# Patient Record
Sex: Female | Born: 1945 | Race: White | Hispanic: No | Smoking: Never smoker
Health system: Southern US, Community
[De-identification: ages and names within clinical notes are randomized; demographics above are authoritative.]

## PROBLEM LIST (undated history)

## (undated) DIAGNOSIS — M25569 Pain in unspecified knee: Secondary | ICD-10-CM

## (undated) DIAGNOSIS — M8580 Other specified disorders of bone density and structure, unspecified site: Secondary | ICD-10-CM

## (undated) DIAGNOSIS — H9313 Tinnitus, bilateral: Secondary | ICD-10-CM

## (undated) DIAGNOSIS — M5432 Sciatica, left side: Secondary | ICD-10-CM

## (undated) DIAGNOSIS — E559 Vitamin D deficiency, unspecified: Secondary | ICD-10-CM

## (undated) DIAGNOSIS — G43909 Migraine, unspecified, not intractable, without status migrainosus: Secondary | ICD-10-CM

## (undated) DIAGNOSIS — E7800 Pure hypercholesterolemia, unspecified: Secondary | ICD-10-CM

## (undated) DIAGNOSIS — M1990 Unspecified osteoarthritis, unspecified site: Secondary | ICD-10-CM

## (undated) DIAGNOSIS — K7689 Other specified diseases of liver: Secondary | ICD-10-CM

## (undated) DIAGNOSIS — I10 Essential (primary) hypertension: Secondary | ICD-10-CM

## (undated) DIAGNOSIS — R7303 Prediabetes: Secondary | ICD-10-CM

## (undated) HISTORY — DX: Pain in unspecified knee: M25.569

## (undated) HISTORY — DX: Tinnitus, bilateral: H93.13

## (undated) HISTORY — PX: TONSILLECTOMY: SUR1361

## (undated) HISTORY — PX: LAPAROSCOPIC CHOLECYSTECTOMY: SUR755

## (undated) HISTORY — PX: ABDOMINAL HYSTERECTOMY: SHX81

## (undated) HISTORY — DX: Vitamin D deficiency, unspecified: E55.9

## (undated) HISTORY — DX: Other specified disorders of bone density and structure, unspecified site: M85.80

## (undated) HISTORY — DX: Sciatica, left side: M54.32

## (undated) HISTORY — DX: Prediabetes: R73.03

## (undated) HISTORY — DX: Pure hypercholesterolemia, unspecified: E78.00

---

## 1998-04-22 ENCOUNTER — Other Ambulatory Visit: Admission: RE | Admit: 1998-04-22 | Discharge: 1998-04-22 | Payer: Self-pay | Admitting: Obstetrics and Gynecology

## 1999-11-02 ENCOUNTER — Encounter: Admission: RE | Admit: 1999-11-02 | Discharge: 1999-11-02 | Payer: Self-pay | Admitting: Obstetrics and Gynecology

## 1999-11-02 ENCOUNTER — Encounter: Payer: Self-pay | Admitting: Obstetrics and Gynecology

## 1999-11-03 ENCOUNTER — Other Ambulatory Visit: Admission: RE | Admit: 1999-11-03 | Discharge: 1999-11-03 | Payer: Self-pay | Admitting: Obstetrics and Gynecology

## 2000-06-17 ENCOUNTER — Encounter: Payer: Self-pay | Admitting: Emergency Medicine

## 2000-06-17 ENCOUNTER — Emergency Department (HOSPITAL_COMMUNITY): Admission: EM | Admit: 2000-06-17 | Discharge: 2000-06-17 | Payer: Self-pay | Admitting: Emergency Medicine

## 2001-01-10 ENCOUNTER — Encounter: Admission: RE | Admit: 2001-01-10 | Discharge: 2001-01-10 | Payer: Self-pay | Admitting: Obstetrics and Gynecology

## 2001-01-10 ENCOUNTER — Encounter: Payer: Self-pay | Admitting: Obstetrics and Gynecology

## 2001-01-26 ENCOUNTER — Other Ambulatory Visit: Admission: RE | Admit: 2001-01-26 | Discharge: 2001-01-26 | Payer: Self-pay | Admitting: Obstetrics and Gynecology

## 2002-02-19 ENCOUNTER — Emergency Department (HOSPITAL_COMMUNITY): Admission: EM | Admit: 2002-02-19 | Discharge: 2002-02-19 | Payer: Self-pay | Admitting: Emergency Medicine

## 2002-02-19 ENCOUNTER — Encounter: Payer: Self-pay | Admitting: Emergency Medicine

## 2002-02-23 ENCOUNTER — Encounter: Payer: Self-pay | Admitting: Family Medicine

## 2002-02-23 ENCOUNTER — Encounter: Admission: RE | Admit: 2002-02-23 | Discharge: 2002-02-23 | Payer: Self-pay | Admitting: Family Medicine

## 2002-02-26 ENCOUNTER — Encounter: Admission: RE | Admit: 2002-02-26 | Discharge: 2002-02-26 | Payer: Self-pay | Admitting: Obstetrics and Gynecology

## 2002-02-26 ENCOUNTER — Encounter: Payer: Self-pay | Admitting: Obstetrics and Gynecology

## 2002-03-02 ENCOUNTER — Encounter: Payer: Self-pay | Admitting: Surgery

## 2002-03-02 ENCOUNTER — Encounter (INDEPENDENT_AMBULATORY_CARE_PROVIDER_SITE_OTHER): Payer: Self-pay | Admitting: Specialist

## 2002-03-02 ENCOUNTER — Ambulatory Visit (HOSPITAL_COMMUNITY): Admission: RE | Admit: 2002-03-02 | Discharge: 2002-03-03 | Payer: Self-pay | Admitting: Surgery

## 2002-05-02 ENCOUNTER — Encounter: Payer: Self-pay | Admitting: Obstetrics and Gynecology

## 2002-05-02 ENCOUNTER — Encounter: Admission: RE | Admit: 2002-05-02 | Discharge: 2002-05-02 | Payer: Self-pay | Admitting: Obstetrics and Gynecology

## 2002-08-24 ENCOUNTER — Other Ambulatory Visit: Admission: RE | Admit: 2002-08-24 | Discharge: 2002-08-24 | Payer: Self-pay | Admitting: *Deleted

## 2002-09-26 ENCOUNTER — Other Ambulatory Visit: Admission: RE | Admit: 2002-09-26 | Discharge: 2002-09-26 | Payer: Self-pay | Admitting: *Deleted

## 2003-04-05 ENCOUNTER — Encounter: Admission: RE | Admit: 2003-04-05 | Discharge: 2003-04-05 | Payer: Self-pay | Admitting: Obstetrics and Gynecology

## 2004-04-29 ENCOUNTER — Encounter: Admission: RE | Admit: 2004-04-29 | Discharge: 2004-04-29 | Payer: Self-pay | Admitting: Obstetrics and Gynecology

## 2005-05-31 ENCOUNTER — Encounter: Admission: RE | Admit: 2005-05-31 | Discharge: 2005-05-31 | Payer: Self-pay | Admitting: Family Medicine

## 2006-06-29 ENCOUNTER — Encounter: Admission: RE | Admit: 2006-06-29 | Discharge: 2006-06-29 | Payer: Self-pay | Admitting: Family Medicine

## 2006-07-12 ENCOUNTER — Encounter: Admission: RE | Admit: 2006-07-12 | Discharge: 2006-07-12 | Payer: Self-pay | Admitting: Family Medicine

## 2006-12-06 ENCOUNTER — Other Ambulatory Visit: Admission: RE | Admit: 2006-12-06 | Discharge: 2006-12-06 | Payer: Self-pay | Admitting: Family Medicine

## 2007-07-31 ENCOUNTER — Encounter: Admission: RE | Admit: 2007-07-31 | Discharge: 2007-07-31 | Payer: Self-pay | Admitting: Family Medicine

## 2008-08-05 ENCOUNTER — Encounter: Admission: RE | Admit: 2008-08-05 | Discharge: 2008-08-05 | Payer: Self-pay | Admitting: Family Medicine

## 2008-12-12 ENCOUNTER — Other Ambulatory Visit: Admission: RE | Admit: 2008-12-12 | Discharge: 2008-12-12 | Payer: Self-pay | Admitting: Family Medicine

## 2009-08-25 ENCOUNTER — Encounter: Admission: RE | Admit: 2009-08-25 | Discharge: 2009-08-25 | Payer: Self-pay | Admitting: Geriatric Medicine

## 2010-06-14 ENCOUNTER — Encounter: Payer: Self-pay | Admitting: Family Medicine

## 2010-09-28 ENCOUNTER — Other Ambulatory Visit: Payer: Self-pay | Admitting: Geriatric Medicine

## 2010-09-28 DIAGNOSIS — Z1231 Encounter for screening mammogram for malignant neoplasm of breast: Secondary | ICD-10-CM

## 2010-10-05 ENCOUNTER — Ambulatory Visit
Admission: RE | Admit: 2010-10-05 | Discharge: 2010-10-05 | Disposition: A | Discharge status: Home / Self Care | Payer: Medicare Other | Source: Ambulatory Visit | Attending: Geriatric Medicine | Admitting: Geriatric Medicine

## 2010-10-05 DIAGNOSIS — Z1231 Encounter for screening mammogram for malignant neoplasm of breast: Secondary | ICD-10-CM

## 2010-10-09 NOTE — Op Note (Signed)
NAME:  Margaret Wilkinson, Margaret Wilkinson                         ACCOUNT NO.:  [id number]   MEDICAL RECORD NO.:  [id number]                   PATIENT TYPE:  OIB   LOCATION:  2550                                 FACILITY:  [facility]   PHYSICIAN:  Abigail Miyamoto, MD                DATE OF BIRTH:  [birth year]   DATE OF PROCEDURE:  03/02/2002  DATE OF DISCHARGE:                                 OPERATIVE REPORT   PREOPERATIVE DIAGNOSIS:  Symptomatic cholelithiasis.   POSTOPERATIVE DIAGNOSIS:  Symptomatic cholelithiasis.   PROCEDURE:  Laparoscopic cholecystectomy with intraoperative cholangiogram.   SURGEON:  Abigail Miyamoto, M.D.   ASSISTANT:  Gabrielle Dare. Janee Morn, M.D.   ANESTHESIA:  General endotracheal anesthesia.   ESTIMATED BLOOD LOSS:  Minimal.   FINDINGS:  The patient was found to have a normal cholangiogram.  Examination of the pelvis revealed a large right ovarian cyst as well as a  fibroid uterus.  The liver looked fairly normal except for a small lesion  posterior on the right dome of the liver.   DESCRIPTION OF PROCEDURE:  The patient was brought into the operating room  and identified positively.  She was placed supine upon the operating table,  and general anesthesia was induced.  Her abdomen was then prepped and draped  in the usual sterile fashion.  Using a #15 blade, a small transverse  incision was made below the umbilicus.  The incision was carried down  through the fascia, which was then opened with a scalpel.  A hemostat was  then used to pass through the peritoneal cavity.  Next a 0 Vicryl  pursestring suture was placed around the fascial opening.  The Hasson port  was placed through the opening, and insufflation of the abdomen was begun.  Next a 12 mm port was placed through the patient's epigastrium and two 5 mm  ports were placed in the patient's right flank under direct vision.  The  pelvis was examined first once the patient was placed in a Trendelenburg  position.  The  patient was found to have a fibroid uterus and a very large  right ovarian cyst.  The liver was also examined, and the only pathology  seen was a small cystic-looking lesion on the right dome of the liver.  At  this point the gallbladder was identified and retracted above the liver bed.  The patient was then placed in reverse Trendelenburg.  Dissection was then  carried out at the base of the gallbladder.  The cystic duct was then  dissected out.  It was clipped once distally, then partly transected with  the scissors.  A cholangiocatheter was inserted through an Angiocath in the  right upper quadrant under direct vision.  The cholangiocatheter was placed  into the cystic duct, and then a cholangiogram was performed under direct  fluoroscopy.  Good flow of contrast was seen in the entire common bile duct,  proximal  biliary radicles, and into the duodenum.  No evidence of stone  blockage was identified.  At this point the cholangiocatheter was removed.  The cystic duct was then clipped three times proximally and transected with  scissors.  The cystic artery was identified and clipped twice proximally and  once distally and transected with scissors.  The gallbladder was then  dissected free from the liver bed with the electrocautery.  Once the  gallbladder was removed from the liver bed, it was placed in an Endosac and  removed through the incision at the umbilicus.  The 0 Vicryl at the  umbilicus was tied in place, closing the fascial defect.  The abdomen was  then copiously irrigated with normal saline.  Again hemostasis appeared to  be achieved.  All ports were then removed under direct vision as the abdomen  was deflated.  All incisions were then anesthetized with 0.25% Marcaine and  closed with 4-0 Vicryl subcuticular sutures.  Steri-Strips, gauze, and tape  were then applied.  The patient tolerated the procedure well.  All sponge,  needle, and instrument counts were correct at the end of  the procedure.  The  patient was then extubated in the operating room and taken in stable  condition to the recovery room.                                               Abigail Miyamoto, MD    DB/MEDQ  D:  03/02/2002  T:  03/02/2002  Job:  [id number]   cc:   Dr. Nathanial Rancher

## 2011-10-29 ENCOUNTER — Other Ambulatory Visit: Payer: Self-pay | Admitting: Nurse Practitioner

## 2011-10-29 DIAGNOSIS — Z780 Asymptomatic menopausal state: Secondary | ICD-10-CM

## 2011-10-29 DIAGNOSIS — Z1231 Encounter for screening mammogram for malignant neoplasm of breast: Secondary | ICD-10-CM

## 2011-11-09 ENCOUNTER — Other Ambulatory Visit: Payer: Self-pay | Admitting: Geriatric Medicine

## 2011-11-09 DIAGNOSIS — M8580 Other specified disorders of bone density and structure, unspecified site: Secondary | ICD-10-CM

## 2011-11-09 DIAGNOSIS — Z1231 Encounter for screening mammogram for malignant neoplasm of breast: Secondary | ICD-10-CM

## 2011-11-09 DIAGNOSIS — Z780 Asymptomatic menopausal state: Secondary | ICD-10-CM

## 2011-11-15 ENCOUNTER — Ambulatory Visit
Admission: RE | Admit: 2011-11-15 | Discharge: 2011-11-15 | Disposition: A | Discharge status: Home / Self Care | Payer: Medicare Other | Source: Ambulatory Visit | Attending: Nurse Practitioner | Admitting: Nurse Practitioner

## 2011-11-15 ENCOUNTER — Other Ambulatory Visit: Payer: Self-pay | Admitting: Nurse Practitioner

## 2011-11-15 ENCOUNTER — Other Ambulatory Visit: Payer: Self-pay | Admitting: Geriatric Medicine

## 2011-11-15 DIAGNOSIS — Z1231 Encounter for screening mammogram for malignant neoplasm of breast: Secondary | ICD-10-CM

## 2011-11-15 DIAGNOSIS — M8580 Other specified disorders of bone density and structure, unspecified site: Secondary | ICD-10-CM

## 2012-12-25 ENCOUNTER — Other Ambulatory Visit: Payer: Self-pay

## 2012-12-25 DIAGNOSIS — Z1231 Encounter for screening mammogram for malignant neoplasm of breast: Secondary | ICD-10-CM

## 2013-01-09 ENCOUNTER — Ambulatory Visit
Admission: RE | Admit: 2013-01-09 | Discharge: 2013-01-09 | Disposition: A | Discharge status: Home / Self Care | Payer: Medicare Other | Source: Ambulatory Visit

## 2013-01-09 DIAGNOSIS — Z1231 Encounter for screening mammogram for malignant neoplasm of breast: Secondary | ICD-10-CM

## 2014-01-29 ENCOUNTER — Other Ambulatory Visit: Payer: Self-pay

## 2014-01-29 DIAGNOSIS — Z1231 Encounter for screening mammogram for malignant neoplasm of breast: Secondary | ICD-10-CM

## 2014-02-08 ENCOUNTER — Ambulatory Visit: Payer: Medicare Other

## 2014-02-25 ENCOUNTER — Ambulatory Visit
Admission: RE | Admit: 2014-02-25 | Discharge: 2014-02-25 | Disposition: A | Discharge status: Home / Self Care | Payer: Medicare Other | Source: Ambulatory Visit

## 2014-02-25 DIAGNOSIS — Z1231 Encounter for screening mammogram for malignant neoplasm of breast: Secondary | ICD-10-CM

## 2014-11-06 DIAGNOSIS — R1011 Right upper quadrant pain: Secondary | ICD-10-CM | POA: Diagnosis not present

## 2014-11-06 DIAGNOSIS — K7689 Other specified diseases of liver: Secondary | ICD-10-CM | POA: Diagnosis not present

## 2014-11-06 DIAGNOSIS — R160 Hepatomegaly, not elsewhere classified: Secondary | ICD-10-CM | POA: Diagnosis not present

## 2014-11-06 DIAGNOSIS — R1031 Right lower quadrant pain: Secondary | ICD-10-CM | POA: Diagnosis not present

## 2014-11-06 DIAGNOSIS — I10 Essential (primary) hypertension: Secondary | ICD-10-CM | POA: Diagnosis not present

## 2014-11-06 DIAGNOSIS — Z9181 History of falling: Secondary | ICD-10-CM | POA: Diagnosis not present

## 2014-11-06 DIAGNOSIS — Z1389 Encounter for screening for other disorder: Secondary | ICD-10-CM | POA: Diagnosis not present

## 2014-11-06 DIAGNOSIS — E559 Vitamin D deficiency, unspecified: Secondary | ICD-10-CM | POA: Diagnosis not present

## 2014-11-08 DIAGNOSIS — Z9049 Acquired absence of other specified parts of digestive tract: Secondary | ICD-10-CM | POA: Diagnosis not present

## 2014-11-08 DIAGNOSIS — R1011 Right upper quadrant pain: Secondary | ICD-10-CM | POA: Diagnosis not present

## 2014-11-08 DIAGNOSIS — K7689 Other specified diseases of liver: Secondary | ICD-10-CM | POA: Diagnosis not present

## 2014-11-08 DIAGNOSIS — K835 Biliary cyst: Secondary | ICD-10-CM | POA: Diagnosis not present

## 2014-12-02 ENCOUNTER — Other Ambulatory Visit: Payer: Self-pay | Admitting: General Surgery

## 2014-12-02 DIAGNOSIS — Q446 Cystic disease of liver: Secondary | ICD-10-CM | POA: Diagnosis not present

## 2014-12-02 DIAGNOSIS — K7689 Other specified diseases of liver: Secondary | ICD-10-CM

## 2014-12-05 ENCOUNTER — Other Ambulatory Visit: Payer: Self-pay

## 2014-12-05 DIAGNOSIS — K8689 Other specified diseases of pancreas: Secondary | ICD-10-CM

## 2014-12-05 NOTE — Addendum Note (Signed)
Addended by: Almond LintBYERLY, Koleman Marling on: 12/05/2014 11:05 AM   Modules accepted: Orders

## 2014-12-06 ENCOUNTER — Other Ambulatory Visit: Payer: Self-pay | Admitting: General Surgery

## 2014-12-06 ENCOUNTER — Inpatient Hospital Stay: Admission: RE | Admit: 2014-12-06 | Payer: Self-pay | Source: Ambulatory Visit

## 2014-12-06 ENCOUNTER — Inpatient Hospital Stay: Admission: RE | Admit: 2014-12-06 | Payer: Medicare Other | Source: Ambulatory Visit

## 2014-12-06 ENCOUNTER — Ambulatory Visit
Admission: RE | Admit: 2014-12-06 | Discharge: 2014-12-06 | Disposition: A | Discharge status: Home / Self Care | Payer: Medicare Other | Source: Ambulatory Visit | Attending: General Surgery | Admitting: General Surgery

## 2014-12-06 DIAGNOSIS — K7689 Other specified diseases of liver: Secondary | ICD-10-CM | POA: Diagnosis not present

## 2014-12-06 DIAGNOSIS — R109 Unspecified abdominal pain: Secondary | ICD-10-CM | POA: Diagnosis not present

## 2014-12-06 DIAGNOSIS — K8689 Other specified diseases of pancreas: Secondary | ICD-10-CM

## 2014-12-06 DIAGNOSIS — Z9049 Acquired absence of other specified parts of digestive tract: Secondary | ICD-10-CM | POA: Diagnosis not present

## 2014-12-06 MED ORDER — IOPAMIDOL (ISOVUE-370) INJECTION 76%
80.0000 mL | Freq: Once | INTRAVENOUS | Status: AC | PRN
  Administered 2014-12-06: 80 mL via INTRAVENOUS

## 2014-12-23 ENCOUNTER — Other Ambulatory Visit: Payer: Self-pay | Admitting: General Surgery

## 2014-12-25 ENCOUNTER — Encounter (HOSPITAL_COMMUNITY)
Admission: RE | Admit: 2014-12-25 | Discharge: 2014-12-25 | Disposition: A | Discharge status: Home / Self Care | Payer: Medicare Other | Source: Ambulatory Visit | Attending: General Surgery | Admitting: General Surgery

## 2014-12-25 ENCOUNTER — Encounter (HOSPITAL_COMMUNITY): Payer: Self-pay

## 2014-12-25 DIAGNOSIS — Z79899 Other long term (current) drug therapy: Secondary | ICD-10-CM

## 2014-12-25 DIAGNOSIS — Z01812 Encounter for preprocedural laboratory examination: Secondary | ICD-10-CM

## 2014-12-25 DIAGNOSIS — R9431 Abnormal electrocardiogram [ECG] [EKG]: Secondary | ICD-10-CM

## 2014-12-25 DIAGNOSIS — I10 Essential (primary) hypertension: Secondary | ICD-10-CM

## 2014-12-25 DIAGNOSIS — Z01818 Encounter for other preprocedural examination: Secondary | ICD-10-CM | POA: Insufficient documentation

## 2014-12-25 DIAGNOSIS — K7689 Other specified diseases of liver: Secondary | ICD-10-CM | POA: Insufficient documentation

## 2014-12-25 DIAGNOSIS — Z0183 Encounter for blood typing: Secondary | ICD-10-CM

## 2014-12-25 DIAGNOSIS — D72829 Elevated white blood cell count, unspecified: Secondary | ICD-10-CM | POA: Diagnosis not present

## 2014-12-25 HISTORY — DX: Essential (primary) hypertension: I10

## 2014-12-25 HISTORY — DX: Other specified diseases of liver: K76.89

## 2014-12-25 LAB — CBC WITH DIFFERENTIAL/PLATELET
Basophils Absolute: 0.1 10*3/uL (ref 0.0–0.1)
Basophils Relative: 1 % (ref 0–1)
EOS PCT: 4 % (ref 0–5)
Eosinophils Absolute: 0.4 10*3/uL (ref 0.0–0.7)
HCT: 43.2 % (ref 36.0–46.0)
HEMOGLOBIN: 14.4 g/dL (ref 12.0–15.0)
LYMPHS PCT: 32 % (ref 12–46)
Lymphs Abs: 3.3 10*3/uL (ref 0.7–4.0)
MCH: 27.7 pg (ref 26.0–34.0)
MCHC: 33.3 g/dL (ref 30.0–36.0)
MCV: 83.1 fL (ref 78.0–100.0)
MONOS PCT: 7 % (ref 3–12)
Monocytes Absolute: 0.7 10*3/uL (ref 0.1–1.0)
Neutro Abs: 6.1 10*3/uL (ref 1.7–7.7)
Neutrophils Relative %: 56 % (ref 43–77)
Platelets: 213 10*3/uL (ref 150–400)
RBC: 5.2 MIL/uL — AB (ref 3.87–5.11)
RDW: 14.2 % (ref 11.5–15.5)
WBC: 10.6 10*3/uL — ABNORMAL HIGH (ref 4.0–10.5)

## 2014-12-25 LAB — ABO/RH: ABO/RH(D): B POS

## 2014-12-25 LAB — COMPREHENSIVE METABOLIC PANEL
ALT: 13 U/L — ABNORMAL LOW (ref 14–54)
AST: 17 U/L (ref 15–41)
Albumin: 3.7 g/dL (ref 3.5–5.0)
Alkaline Phosphatase: 84 U/L (ref 38–126)
Anion gap: 9 (ref 5–15)
BUN: 16 mg/dL (ref 6–20)
CALCIUM: 9 mg/dL (ref 8.9–10.3)
CHLORIDE: 102 mmol/L (ref 101–111)
CO2: 29 mmol/L (ref 22–32)
CREATININE: 0.88 mg/dL (ref 0.44–1.00)
GFR calc Af Amer: >60 mL/min (ref >60)
GFR calc non Af Amer: >60 mL/min (ref >60)
Glucose, Bld: 108 mg/dL — ABNORMAL HIGH (ref 65–99)
Potassium: 3.4 mmol/L — ABNORMAL LOW (ref 3.5–5.1)
SODIUM: 140 mmol/L (ref 135–145)
Total Bilirubin: 0.5 mg/dL (ref 0.3–1.2)
Total Protein: 6.5 g/dL (ref 6.5–8.1)

## 2014-12-25 LAB — PROTIME-INR
INR: 1.01 (ref 0.00–1.49)
Prothrombin Time: 13.5 seconds (ref 11.6–15.2)

## 2014-12-25 LAB — TYPE AND SCREEN
ABO/RH(D): B POS
Antibody Screen: NEGATIVE

## 2014-12-25 NOTE — Progress Notes (Signed)
Anesthesia Chart Review:  Pt is 69 year old female scheduled for laparoscopic liver cyst unroofing on 12/26/2014 with Dr. Donell Beers.   PMH includes: HTN. Never smoker. BMI 32.   Medications include: atenolol, lisinopril-hctz, maxalt.   Preoperative labs reviewed.    EKG 12/25/2014: NSR. Inferior infarct, age undetermine. Cannot rule out anterior infarct, age undetermined.   Pt reports having stress test, possibly echo (??) at [facility] in 2015. Attempting to get records.   Reviewed EKG with Dr. Noreene Larsson. Appears unchanged from previous tracing from 2003.   If records received from [facility] will revisit chart.   Rica Mast, FNP-BC [facility]/Anesthesiology Phone: [phone] 12/25/2014 4:33 PM

## 2014-12-25 NOTE — Progress Notes (Signed)
NOTIFIED Margaret Wilkinson OF ABNORMAL EKG. STILL WAITING FOR CARDIAC STUDIES REQUESTED FROM [facility].

## 2014-12-25 NOTE — Progress Notes (Signed)
REQUESTED STRESS TEST, EKG, ED VISIT, ?? ECHO IF DONE FROM [facility] [phone])

## 2014-12-25 NOTE — Anesthesia Preprocedure Evaluation (Addendum)
Anesthesia Evaluation  Patient identified by MRN, date of birth, ID band Patient awake    Reviewed: Allergy & Precautions, NPO status , Patient's Chart, lab work & pertinent test results, reviewed documented beta blocker date and time   Airway Mallampati: II   Neck ROM: Full    Dental  (+) Dental Advisory Given, Teeth Intact   Pulmonary neg pulmonary ROS,  breath sounds clear to auscultation        Cardiovascular hypertension, Pt. on medications Rhythm:Regular  EKG ANT and INF MI possible, no change since 2003   Neuro/Psych    GI/Hepatic negative GI ROS, LIVER CYST   Endo/Other    Renal/GU      Musculoskeletal   Abdominal (+)  Abdomen: soft.    Peds  Hematology 14/43   Anesthesia Other Findings   Reproductive/Obstetrics                           Anesthesia Physical Anesthesia Plan  ASA: III  Anesthesia Plan: General   Post-op Pain Management:    Induction: Intravenous  Airway Management Planned: Oral ETT  Additional Equipment:   Intra-op Plan:   Post-operative Plan: Extubation in OR  Informed Consent: I have reviewed the patients History and Physical, chart, labs and discussed the procedure including the risks, benefits and alternatives for the proposed anesthesia with the patient or authorized representative who has indicated his/her understanding and acceptance.     Plan Discussed with:   Anesthesia Plan Comments:         Anesthesia Quick Evaluation

## 2014-12-25 NOTE — Pre-Procedure Instructions (Signed)
Margaret Wilkinson  12/25/2014      [id number] - [place] - [address] [place] [address] AT [facility] [address] [place] [address] Phone: [phone] Fax: [phone]    Your procedure is scheduled on   Thursday  12/26/14  Report to [facility] Admitting at 1100 A.M.  Call this number if you have problems the morning of surgery:  [phone]   Remember:  Do not eat food or drink liquids after midnight.  Take these medicines the morning of surgery with A SIP OF WATER  ATENOLOL (TENORMIN) (STOP ASPIRIN, COUMADIN, PLAVIX, EFFIENT, HERBAL MEDICINES , MULTIVITAMIN)   Do not wear jewelry, make-up or nail polish.  Do not wear lotions, powders, or perfumes.  You may wear deodorant.  Do not shave 48 hours prior to surgery.  Men may shave face and neck.  Do not bring valuables to the hospital.  [facility] is not responsible for any belongings or valuables.  Contacts, dentures or bridgework may not be worn into surgery.  Leave your suitcase in the car.  After surgery it may be brought to your room.  For patients admitted to the hospital, discharge time will be determined by your treatment team.  Patients discharged the day of surgery will not be allowed to drive home.   Name and phone number of your driver:    Special instructions:  [facility] - Preparing for Surgery  Before surgery, you can play an important role.  Because skin is not sterile, your skin needs to be as free of germs as possible.  You can reduce the number of germs on you skin by washing with CHG (chlorahexidine gluconate) soap before surgery.  CHG is an antiseptic cleaner which kills germs and bonds with the skin to continue killing germs even after washing.  Please DO NOT use if you have an allergy to CHG or antibacterial soaps.  If your skin becomes reddened/irritated stop using the CHG and inform your nurse when you arrive at Short Stay.  Do not shave (including legs  and underarms) for at least 48 hours prior to the first CHG shower.  You may shave your face.  Please follow these instructions carefully:   1.  Shower with CHG Soap the night before surgery and the                                morning of Surgery.  2.  If you choose to wash your hair, wash your hair first as usual with your       normal shampoo.  3.  After you shampoo, rinse your hair and body thoroughly to remove the                      Shampoo.  4.  Use CHG as you would any other liquid soap.  You can apply chg directly       to the skin and wash gently with scrungie or a clean washcloth.  5.  Apply the CHG Soap to your body ONLY FROM THE NECK DOWN.        Do not use on open wounds or open sores.  Avoid contact with your eyes,       ears, mouth and genitals (private parts).  Wash genitals (private parts)       with your normal soap.  6.  Wash thoroughly, paying special attention to the area where your surgery        will be performed.  7.  Thoroughly rinse your body with warm water from the neck down.  8.  DO NOT shower/wash with your normal soap after using and rinsing off       the CHG Soap.  9.  Pat yourself dry with a clean towel.            10.  Wear clean pajamas.            11.  Place clean sheets on your bed the night of your first shower and do not        sleep with pets.  Day of Surgery  Do not apply any lotions/deoderants the morning of surgery.  Please wear clean clothes to the hospital/surgery center.    Please read over the following fact sheets that you were given. Pain Booklet, Coughing and Deep Breathing, Blood Transfusion Information, MRSA Information and Surgical Site Infection Prevention

## 2014-12-26 ENCOUNTER — Ambulatory Visit (HOSPITAL_COMMUNITY)
Admission: RE | Admit: 2014-12-26 | Discharge: 2014-12-30 | Disposition: A | Discharge status: Home / Self Care | Payer: Medicare Other | Source: Ambulatory Visit | Attending: General Surgery | Admitting: General Surgery

## 2014-12-26 ENCOUNTER — Ambulatory Visit (HOSPITAL_COMMUNITY): Payer: Medicare Other | Admitting: Anesthesiology

## 2014-12-26 ENCOUNTER — Encounter (HOSPITAL_COMMUNITY)
Admission: RE | Disposition: A | Discharge status: Home / Self Care | Payer: Self-pay | Source: Ambulatory Visit | Attending: General Surgery

## 2014-12-26 ENCOUNTER — Encounter (HOSPITAL_COMMUNITY): Payer: Self-pay | Admitting: *Deleted

## 2014-12-26 ENCOUNTER — Ambulatory Visit (HOSPITAL_COMMUNITY): Payer: Medicare Other | Admitting: Emergency Medicine

## 2014-12-26 DIAGNOSIS — I10 Essential (primary) hypertension: Secondary | ICD-10-CM | POA: Insufficient documentation

## 2014-12-26 DIAGNOSIS — M1990 Unspecified osteoarthritis, unspecified site: Secondary | ICD-10-CM | POA: Diagnosis not present

## 2014-12-26 DIAGNOSIS — Z79899 Other long term (current) drug therapy: Secondary | ICD-10-CM | POA: Insufficient documentation

## 2014-12-26 DIAGNOSIS — D72829 Elevated white blood cell count, unspecified: Secondary | ICD-10-CM | POA: Insufficient documentation

## 2014-12-26 DIAGNOSIS — K7689 Other specified diseases of liver: Secondary | ICD-10-CM | POA: Diagnosis not present

## 2014-12-26 HISTORY — DX: Unspecified osteoarthritis, unspecified site: M19.90

## 2014-12-26 HISTORY — PX: LAPAROSCOPIC LIVER CYST UNROOFING: SHX5901

## 2014-12-26 HISTORY — DX: Migraine, unspecified, not intractable, without status migrainosus: G43.909

## 2014-12-26 HISTORY — PX: LAPAROSCOPIC HEPATECTOMY: SHX5897

## 2014-12-26 SURGERY — HEPATECTOMY, LAPAROSCOPIC
Anesthesia: General | Site: Abdomen

## 2014-12-26 MED ORDER — SODIUM CHLORIDE 0.9 % IR SOLN
Status: DC | PRN
  Administered 2014-12-26: 1000 mL

## 2014-12-26 MED ORDER — GLYCOPYRROLATE 0.2 MG/ML IJ SOLN
INTRAMUSCULAR | Status: AC
  Filled 2014-12-26: qty 3

## 2014-12-26 MED ORDER — LISINOPRIL-HYDROCHLOROTHIAZIDE 20-12.5 MG PO TABS
1.0000 | ORAL_TABLET | Freq: Every day | ORAL | Status: DC
Start: 2014-12-26 — End: 2014-12-26

## 2014-12-26 MED ORDER — LIDOCAINE HCL (CARDIAC) 20 MG/ML IV SOLN
INTRAVENOUS | Status: AC
  Filled 2014-12-26: qty 5

## 2014-12-26 MED ORDER — CEFAZOLIN SODIUM-DEXTROSE 2-3 GM-% IV SOLR
INTRAVENOUS | Status: AC
  Filled 2014-12-26: qty 50

## 2014-12-26 MED ORDER — MEPERIDINE HCL 25 MG/ML IJ SOLN: 6.2500 mg | INTRAMUSCULAR | Status: DC | PRN

## 2014-12-26 MED ORDER — ARTIFICIAL TEARS OP OINT
TOPICAL_OINTMENT | OPHTHALMIC | Status: DC | PRN
  Administered 2014-12-26: 1 via OPHTHALMIC

## 2014-12-26 MED ORDER — LACTATED RINGERS IV SOLN
INTRAVENOUS | Status: DC
  Administered 2014-12-26: 50 mL/h via INTRAVENOUS

## 2014-12-26 MED ORDER — DEXAMETHASONE SODIUM PHOSPHATE 4 MG/ML IJ SOLN
INTRAMUSCULAR | Status: AC
Start: 2014-12-26 — End: 2014-12-26
  Filled 2014-12-26: qty 3

## 2014-12-26 MED ORDER — DOCUSATE SODIUM 100 MG PO CAPS
100.0000 mg | ORAL_CAPSULE | Freq: Two times a day (BID) | ORAL | Status: DC
  Administered 2014-12-26 – 2014-12-30 (×8): 100 mg via ORAL
  Filled 2014-12-26 (×8): qty 1

## 2014-12-26 MED ORDER — GLYCOPYRROLATE 0.2 MG/ML IJ SOLN
INTRAMUSCULAR | Status: DC | PRN
  Administered 2014-12-26: 0.3 mg via INTRAVENOUS

## 2014-12-26 MED ORDER — LISINOPRIL 20 MG PO TABS
20.0000 mg | ORAL_TABLET | Freq: Every day | ORAL | Status: DC
  Administered 2014-12-29 – 2014-12-30 (×2): 20 mg via ORAL
  Filled 2014-12-26 (×4): qty 1

## 2014-12-26 MED ORDER — FENTANYL CITRATE (PF) 250 MCG/5ML IJ SOLN
INTRAMUSCULAR | Status: AC
  Filled 2014-12-26: qty 5

## 2014-12-26 MED ORDER — METHOCARBAMOL 500 MG PO TABS
500.0000 mg | ORAL_TABLET | Freq: Four times a day (QID) | ORAL | Status: DC | PRN
  Administered 2014-12-27: 500 mg via ORAL
  Filled 2014-12-26: qty 1

## 2014-12-26 MED ORDER — ONDANSETRON HCL 4 MG/2ML IJ SOLN
INTRAMUSCULAR | Status: DC | PRN
  Administered 2014-12-26 (×2): 4 mg via INTRAVENOUS

## 2014-12-26 MED ORDER — IBUPROFEN 600 MG PO TABS: 600.0000 mg | ORAL_TABLET | Freq: Four times a day (QID) | ORAL | Status: DC | PRN

## 2014-12-26 MED ORDER — SIMETHICONE 80 MG PO CHEW
40.0000 mg | CHEWABLE_TABLET | Freq: Four times a day (QID) | ORAL | Status: DC | PRN
  Administered 2014-12-28: 40 mg via ORAL
  Filled 2014-12-26: qty 1

## 2014-12-26 MED ORDER — CEFAZOLIN SODIUM-DEXTROSE 2-3 GM-% IV SOLR
2.0000 g | INTRAVENOUS | Status: AC
  Administered 2014-12-26: 2 g via INTRAVENOUS

## 2014-12-26 MED ORDER — DIPHENHYDRAMINE HCL 50 MG/ML IJ SOLN: 12.5000 mg | Freq: Four times a day (QID) | INTRAMUSCULAR | Status: DC | PRN

## 2014-12-26 MED ORDER — CEFAZOLIN SODIUM-DEXTROSE 2-3 GM-% IV SOLR
2.0000 g | Freq: Three times a day (TID) | INTRAVENOUS | Status: AC
  Administered 2014-12-26: 2 g via INTRAVENOUS
  Filled 2014-12-26: qty 50

## 2014-12-26 MED ORDER — BUPIVACAINE-EPINEPHRINE (PF) 0.25% -1:200000 IJ SOLN
INTRAMUSCULAR | Status: DC | PRN
  Administered 2014-12-26: 11 mL

## 2014-12-26 MED ORDER — MORPHINE SULFATE 2 MG/ML IJ SOLN
1.0000 mg | INTRAMUSCULAR | Status: DC | PRN
  Administered 2014-12-26 – 2014-12-27 (×2): 2 mg via INTRAVENOUS
  Filled 2014-12-26 (×2): qty 1

## 2014-12-26 MED ORDER — KCL IN DEXTROSE-NACL 20-5-0.45 MEQ/L-%-% IV SOLN
INTRAVENOUS | Status: DC
  Administered 2014-12-26 – 2014-12-28 (×4): via INTRAVENOUS
  Filled 2014-12-26 (×3): qty 1000

## 2014-12-26 MED ORDER — PROMETHAZINE HCL 25 MG/ML IJ SOLN: 6.2500 mg | INTRAMUSCULAR | Status: DC | PRN

## 2014-12-26 MED ORDER — ONDANSETRON HCL 4 MG/2ML IJ SOLN
4.0000 mg | Freq: Four times a day (QID) | INTRAMUSCULAR | Status: DC | PRN
  Administered 2014-12-26 – 2014-12-28 (×3): 4 mg via INTRAVENOUS
  Filled 2014-12-26 (×4): qty 2

## 2014-12-26 MED ORDER — PROMETHAZINE HCL 25 MG/ML IJ SOLN
12.5000 mg | Freq: Four times a day (QID) | INTRAMUSCULAR | Status: DC | PRN
Start: 2014-12-26 — End: 2014-12-30
  Administered 2014-12-26 – 2014-12-28 (×3): 12.5 mg via INTRAVENOUS
  Filled 2014-12-26 (×5): qty 1

## 2014-12-26 MED ORDER — FENTANYL CITRATE (PF) 100 MCG/2ML IJ SOLN: 25.0000 ug | INTRAMUSCULAR | Status: DC | PRN

## 2014-12-26 MED ORDER — MIDAZOLAM HCL 5 MG/5ML IJ SOLN
INTRAMUSCULAR | Status: DC | PRN
  Administered 2014-12-26: 2 mg via INTRAVENOUS

## 2014-12-26 MED ORDER — PROPOFOL 10 MG/ML IV BOLUS
INTRAVENOUS | Status: AC
  Filled 2014-12-26: qty 20

## 2014-12-26 MED ORDER — DEXAMETHASONE SODIUM PHOSPHATE 4 MG/ML IJ SOLN
INTRAMUSCULAR | Status: DC | PRN
  Administered 2014-12-26: 12 mg via INTRAVENOUS

## 2014-12-26 MED ORDER — DIPHENHYDRAMINE HCL 12.5 MG/5ML PO ELIX: 12.5000 mg | ORAL_SOLUTION | Freq: Four times a day (QID) | ORAL | Status: DC | PRN

## 2014-12-26 MED ORDER — LACTATED RINGERS IV SOLN
INTRAVENOUS | Status: DC | PRN
  Administered 2014-12-26 (×2): via INTRAVENOUS

## 2014-12-26 MED ORDER — VECURONIUM BROMIDE 10 MG IV SOLR
INTRAVENOUS | Status: AC
  Filled 2014-12-26: qty 10

## 2014-12-26 MED ORDER — ROCURONIUM BROMIDE 100 MG/10ML IV SOLN
INTRAVENOUS | Status: DC | PRN
  Administered 2014-12-26: 50 mg via INTRAVENOUS

## 2014-12-26 MED ORDER — METOCLOPRAMIDE HCL 5 MG/ML IJ SOLN
INTRAMUSCULAR | Status: DC | PRN
  Administered 2014-12-26: 10 mg via INTRAVENOUS

## 2014-12-26 MED ORDER — LIDOCAINE HCL (CARDIAC) 20 MG/ML IV SOLN
INTRAVENOUS | Status: DC | PRN
  Administered 2014-12-26: 60 mg via INTRAVENOUS

## 2014-12-26 MED ORDER — LIDOCAINE HCL (PF) 1 % IJ SOLN
INTRAMUSCULAR | Status: AC
  Filled 2014-12-26: qty 30

## 2014-12-26 MED ORDER — ONDANSETRON 4 MG PO TBDP
4.0000 mg | ORAL_TABLET | Freq: Four times a day (QID) | ORAL | Status: DC | PRN
  Filled 2014-12-26: qty 1

## 2014-12-26 MED ORDER — BUPIVACAINE-EPINEPHRINE (PF) 0.25% -1:200000 IJ SOLN
INTRAMUSCULAR | Status: AC
  Filled 2014-12-26: qty 30

## 2014-12-26 MED ORDER — NEOSTIGMINE METHYLSULFATE 10 MG/10ML IV SOLN
INTRAVENOUS | Status: AC
  Filled 2014-12-26: qty 2

## 2014-12-26 MED ORDER — ATENOLOL 50 MG PO TABS
50.0000 mg | ORAL_TABLET | Freq: Every day | ORAL | Status: DC
  Administered 2014-12-29 – 2014-12-30 (×2): 50 mg via ORAL
  Filled 2014-12-26 (×4): qty 1

## 2014-12-26 MED ORDER — OXYCODONE HCL 5 MG PO TABS
5.0000 mg | ORAL_TABLET | ORAL | Status: DC | PRN
  Administered 2014-12-26 – 2014-12-27 (×2): 10 mg via ORAL
  Administered 2014-12-27: 5 mg via ORAL
  Administered 2014-12-27 – 2014-12-29 (×4): 10 mg via ORAL
  Administered 2014-12-30: 5 mg via ORAL
  Filled 2014-12-26: qty 1
  Filled 2014-12-26 (×4): qty 2
  Filled 2014-12-26: qty 1
  Filled 2014-12-26 (×2): qty 2

## 2014-12-26 MED ORDER — ONDANSETRON HCL 4 MG/2ML IJ SOLN
INTRAMUSCULAR | Status: AC
  Filled 2014-12-26: qty 2

## 2014-12-26 MED ORDER — 0.9 % SODIUM CHLORIDE (POUR BTL) OPTIME
TOPICAL | Status: DC | PRN
  Administered 2014-12-26: 1000 mL

## 2014-12-26 MED ORDER — FENTANYL CITRATE (PF) 100 MCG/2ML IJ SOLN
INTRAMUSCULAR | Status: DC | PRN
  Administered 2014-12-26 (×4): 50 ug via INTRAVENOUS
  Administered 2014-12-26: 100 ug via INTRAVENOUS
  Administered 2014-12-26: 50 ug via INTRAVENOUS
  Administered 2014-12-26: 100 ug via INTRAVENOUS
  Administered 2014-12-26: 50 ug via INTRAVENOUS

## 2014-12-26 MED ORDER — STERILE WATER FOR INJECTION IJ SOLN
INTRAMUSCULAR | Status: AC
  Filled 2014-12-26: qty 10

## 2014-12-26 MED ORDER — PNEUMOCOCCAL VAC POLYVALENT 25 MCG/0.5ML IJ INJ
0.5000 mL | INJECTION | INTRAMUSCULAR | Status: DC
  Filled 2014-12-26 (×2): qty 0.5

## 2014-12-26 MED ORDER — METOCLOPRAMIDE HCL 5 MG/ML IJ SOLN
INTRAMUSCULAR | Status: AC
  Filled 2014-12-26: qty 2

## 2014-12-26 MED ORDER — SUMATRIPTAN SUCCINATE 50 MG PO TABS
50.0000 mg | ORAL_TABLET | ORAL | Status: DC | PRN
  Filled 2014-12-26: qty 1

## 2014-12-26 MED ORDER — HYDROCHLOROTHIAZIDE 12.5 MG PO CAPS
12.5000 mg | ORAL_CAPSULE | Freq: Every day | ORAL | Status: DC
  Administered 2014-12-29 – 2014-12-30 (×2): 12.5 mg via ORAL
  Filled 2014-12-26 (×4): qty 1

## 2014-12-26 MED ORDER — DEXAMETHASONE SODIUM PHOSPHATE 4 MG/ML IJ SOLN
INTRAMUSCULAR | Status: AC
  Filled 2014-12-26: qty 1

## 2014-12-26 MED ORDER — NEOSTIGMINE METHYLSULFATE 10 MG/10ML IV SOLN
INTRAVENOUS | Status: DC | PRN
  Administered 2014-12-26: 2.5 mg via INTRAVENOUS

## 2014-12-26 MED ORDER — MIDAZOLAM HCL 2 MG/2ML IJ SOLN
INTRAMUSCULAR | Status: AC
  Filled 2014-12-26: qty 4

## 2014-12-26 MED ORDER — PROPOFOL 10 MG/ML IV BOLUS
INTRAVENOUS | Status: DC | PRN
  Administered 2014-12-26: 100 mg via INTRAVENOUS

## 2014-12-26 MED ORDER — HEMOSTATIC AGENTS (NO CHARGE) OPTIME
TOPICAL | Status: DC | PRN
  Administered 2014-12-26: 3 via TOPICAL

## 2014-12-26 SURGICAL SUPPLY — 55 items
APPLIER CLIP ROT 10 11.4 M/L (STAPLE) ×2
CANISTER SUCTION 2500CC (MISCELLANEOUS) ×4 IMPLANT
CHLORAPREP W/TINT 26ML (MISCELLANEOUS) ×2 IMPLANT
CLIP APPLIE ROT 10 11.4 M/L (STAPLE) ×1 IMPLANT
COVER SURGICAL LIGHT HANDLE (MISCELLANEOUS) ×2 IMPLANT
DERMABOND ADVANCED (GAUZE/BANDAGES/DRESSINGS) ×1
DERMABOND ADVANCED .7 DNX12 (GAUZE/BANDAGES/DRESSINGS) ×1 IMPLANT
DOPPLER CAUTERY SUPPRESSOR (INSTRUMENTS) IMPLANT
DRAIN CHANNEL 19F RND (DRAIN) ×2 IMPLANT
ELECT PAD DSPR THERM+ ADLT (MISCELLANEOUS) IMPLANT
ELECT REM PT RETURN 9FT ADLT (ELECTROSURGICAL) ×2
ELECTRODE REM PT RTRN 9FT ADLT (ELECTROSURGICAL) ×1 IMPLANT
EVACUATOR SILICONE 100CC (DRAIN) ×2 IMPLANT
GLOVE BIO SURGEON STRL SZ 6 (GLOVE) ×2 IMPLANT
GLOVE BIO SURGEON STRL SZ7 (GLOVE) ×2 IMPLANT
GLOVE BIO SURGEON STRL SZ7.5 (GLOVE) ×2 IMPLANT
GLOVE BIOGEL PI IND STRL 7.0 (GLOVE) ×3 IMPLANT
GLOVE BIOGEL PI IND STRL 7.5 (GLOVE) ×1 IMPLANT
GLOVE BIOGEL PI INDICATOR 7.0 (GLOVE) ×3
GLOVE BIOGEL PI INDICATOR 7.5 (GLOVE) ×1
GLOVE INDICATOR 6.5 STRL GRN (GLOVE) ×2 IMPLANT
GOWN STRL REUS W/ TWL LRG LVL3 (GOWN DISPOSABLE) ×2 IMPLANT
GOWN STRL REUS W/TWL 2XL LVL3 (GOWN DISPOSABLE) ×2 IMPLANT
GOWN STRL REUS W/TWL LRG LVL3 (GOWN DISPOSABLE) ×2
HEMOSTAT SNOW SURGICEL 2X4 (HEMOSTASIS) ×2 IMPLANT
KIT BASIN OR (CUSTOM PROCEDURE TRAY) ×2 IMPLANT
KIT ROOM TURNOVER OR (KITS) ×2 IMPLANT
NS IRRIG 1000ML POUR BTL (IV SOLUTION) ×2 IMPLANT
PAD ARMBOARD 7.5X6 YLW CONV (MISCELLANEOUS) ×4 IMPLANT
PENCIL BUTTON HOLSTER BLD 10FT (ELECTRODE) ×2 IMPLANT
POUCH SPECIMEN RETRIEVAL 10MM (ENDOMECHANICALS) ×2 IMPLANT
RELOAD BLUE (STAPLE) IMPLANT
RELOAD WHITE ECR60W (STAPLE) ×8 IMPLANT
SCALPEL HARMONIC ACE (MISCELLANEOUS) ×4 IMPLANT
SCISSORS HARMONIC WAVE 18CM (INSTRUMENTS) IMPLANT
SEALANT SURGICAL APPL DUAL CAN (MISCELLANEOUS) IMPLANT
SET IRRIG TUBING LAPAROSCOPIC (IRRIGATION / IRRIGATOR) ×2 IMPLANT
SLEEVE ENDOPATH XCEL 5M (ENDOMECHANICALS) ×4 IMPLANT
SPECIMEN JAR MEDIUM (MISCELLANEOUS) ×2 IMPLANT
STAPLE ECHEON FLEX 60 POW ENDO (STAPLE) ×2 IMPLANT
STAPLER STANDARD HANDLE (STAPLE) ×2 IMPLANT
SUT ETHILON 2 0 FS 18 (SUTURE) ×2 IMPLANT
SUT MNCRL AB 4-0 PS2 18 (SUTURE) ×2 IMPLANT
SYS LAPSCP GELPORT 120MM (MISCELLANEOUS)
SYSTEM LAPSCP GELPORT 120MM (MISCELLANEOUS) IMPLANT
TOWEL OR 17X24 6PK STRL BLUE (TOWEL DISPOSABLE) IMPLANT
TOWEL OR 17X26 10 PK STRL BLUE (TOWEL DISPOSABLE) ×2 IMPLANT
TRAP SPECIMEN MUCOUS 40CC (MISCELLANEOUS) ×2 IMPLANT
TRAY FOLEY CATH 16FRSI W/METER (SET/KITS/TRAYS/PACK) IMPLANT
TRAY LAPAROSCOPIC MC (CUSTOM PROCEDURE TRAY) ×2 IMPLANT
TROCAR XCEL 12X100 BLDLESS (ENDOMECHANICALS) ×2 IMPLANT
TROCAR XCEL BLUNT TIP 100MML (ENDOMECHANICALS) ×2 IMPLANT
TROCAR XCEL NON-BLD 5MMX100MML (ENDOMECHANICALS) ×2 IMPLANT
TUBING FILTER THERMOFLATOR (ELECTROSURGICAL) ×2 IMPLANT
TUBING INSUFFLATION (TUBING) ×4 IMPLANT

## 2014-12-26 NOTE — Anesthesia Procedure Notes (Signed)
Procedure Name: Intubation Date/Time: 12/26/2014 1:24 PM Performed by: Wray Kearns A Pre-anesthesia Checklist: Patient identified, Timeout performed, Emergency Drugs available, Suction available and Patient being monitored Patient Re-evaluated:Patient Re-evaluated prior to inductionOxygen Delivery Method: Circle system utilized Preoxygenation: Pre-oxygenation with 100% oxygen Intubation Type: IV induction and Cricoid Pressure applied Ventilation: Mask ventilation without difficulty and Oral airway inserted - appropriate to patient size Laryngoscope Size: Mac and 4 Grade View: Grade I Tube type: Oral Tube size: 7.0 mm Number of attempts: 3 Airway Equipment and Method: Rigid stylet and Video-laryngoscopy Placement Confirmation: ETT inserted through vocal cords under direct vision,  breath sounds checked- equal and bilateral and positive ETCO2 Secured at: 22 cm Tube secured with: Tape Dental Injury: Teeth and Oropharynx as per pre-operative assessment  Difficulty Due To: Difficulty was unanticipated, Difficult Airway- due to reduced neck mobility, Difficult Airway- due to anterior larynx, Difficult Airway- due to dentition and Difficult Airway- due to limited oral opening Future Recommendations: Recommend- induction with short-acting agent, and alternative techniques readily available Comments: Induction , attempt times two #4 MAC Blade Laryngoscopy / Intubation , R. Osten Janek CRNA , Video Glydescope Laryngoscopy per Dr. Mable Fill times one and pt successfully Intubated with Direct Vision , BBSE check , Positive ETCO2 , SaO2 98-100% throughout Induction . Recommend Video Glydescope for future Intubations . Ortencia Kick , CRNA .

## 2014-12-26 NOTE — Discharge Instructions (Signed)
[facility]      [facility] °[phone] ° °ABDOMINAL SURGERY: POST OP INSTRUCTIONS ° °Always review your discharge instruction sheet given to you by the facility where your surgery was performed. ° °IF YOU HAVE DISABILITY OR FAMILY LEAVE FORMS, YOU MUST BRING THEM TO THE OFFICE FOR PROCESSING.  PLEASE DO NOT GIVE THEM TO YOUR DOCTOR. ° °1. A prescription for pain medication may be given to you upon discharge.  Take your pain medication as prescribed, if needed.  If narcotic pain medicine is not needed, then you may take acetaminophen (Tylenol) or ibuprofen (Advil) as needed. °2. Take your usually prescribed medications unless otherwise directed. °3. If you need a refill on your pain medication, please contact your pharmacy. They will contact our office to request authorization.  Prescriptions will not be filled after 5pm or on week-ends. °4. You should follow a light diet the first few days after arrival home, such as soup and crackers, pudding, etc.unless your doctor has advised otherwise. A high-fiber, low fat diet can be resumed as tolerated.   Be sure to include lots of fluids daily. Most patients will experience some swelling and bruising on the chest and neck area.  Ice packs will help.  Swelling and bruising can take several days to resolve °5. Most patients will experience some swelling and bruising in the area of the incision. Ice pack will help. Swelling and bruising can take several days to resolve..  °6. It is common to experience some constipation if taking pain medication after surgery.  Increasing fluid intake and taking a stool softener will usually help or prevent this problem from occurring.  A mild laxative (Milk of Magnesia or Miralax) should be taken according to package directions if there are no bowel movements after 48 hours. °7.  You may have steri-strips (small skin tapes) in place directly over the incision.  These strips should be left on the skin for 10-14 days.  If your  surgeon used skin glue on the incision, you may shower in 48 hours.  The glue will flake off over the next 2-3 weeks.  Any sutures or staples will be removed at the office during your follow-up visit. You may find that a light gauze bandage over your incision may keep your staples from being rubbed or pulled. You may shower and replace the bandage daily. °8. ACTIVITIES:  You may resume regular (light) daily activities beginning the next day--such as daily self-care, walking, climbing stairs--gradually increasing activities as tolerated.  You may have sexual intercourse when it is comfortable.  Refrain from any heavy lifting or straining until approved by your doctor. °a. You may drive when you no longer are taking prescription pain medication, you can comfortably wear a seatbelt, and you can safely maneuver your car and apply brakes °b. Return to Work: __________2 weeks if applicable_________________________ °9. You should see your doctor in the office for a follow-up appointment approximately two weeks after your surgery.  Make sure that you call for this appointment within a day or two after you arrive home to insure a convenient appointment time. °OTHER INSTRUCTIONS:  °_____________________________________________________________ °_____________________________________________________________ ° °WHEN TO CALL YOUR DOCTOR: °1. Fever over 101.0 °2. Inability to urinate °3. Nausea and/or vomiting °4. Extreme swelling or bruising °5. Continued bleeding from incision. °6. Increased pain, redness, or drainage from the incision. °7. Difficulty swallowing or breathing °8. Muscle cramping or spasms. °9. Numbness or tingling in hands or feet or around lips. ° °The clinic staff is   available to answer your questions during regular business hours.  Please don’t hesitate to call and ask to speak to one of the nurses if you have concerns. ° °For further questions, please visit [email or web address] ° ° ° °

## 2014-12-26 NOTE — Op Note (Signed)
Author: Almond Lint, MD Service: Surgery Author Type: Physician      Status: Signed   Editor: Almond Lint, MD (Physician)      PRE-OPERATIVE DIAGNOSIS: liver cyst, with calcified area  POST-OPERATIVE DIAGNOSIS: Same  PROCEDURE: Procedure(s): Laparoscopic liver cyst unroofing (marsupialization or fenestration) with partial hepatectomy of calcified lesion.    SURGEON: Surgeon(s): Almond Lint, MD  ASSISTANT:   Magnus Ivan, RNFA  ANESTHESIA: local and general  DRAINS: none   LOCAL MEDICATIONS USED: BUPIVICAINE and LIDOCAINE   SPECIMEN: Source of Specimen: liver cyst wall, portion of liver, and liver cyst fluid  DISPOSITION OF SPECIMEN: PATHOLOGY  COUNTS: YES  DICTATION: .Dragon Dictation  PLAN OF CARE: Admit to inpatient   PATIENT DISPOSITION: PACU - hemodynamically stable.  FINDINGS: Large multiloculated liver cyst with solid component with calcifications.    EBL: 400 mL  PROCEDURE:   Pt was identified in the holding area and taken to the OR where she was placed supine on the operating room table. General anesthesia was induced. The patient's abdomen was prepped and draped in sterile fashion. A timeout was performed according to the surgical safety checklist. When all was correct, we continued.   The infraumbilical skin was infiltrated with local anesthetic in the site of the previous lap chole incision. A transverse curvilinear incision was made with the #11 blade. A kelly clamp was used to spread the subcutaneous skin. Two Kocher clamps were used to elevate the fascia. The #11 blade was used to incise the fascia. A 0-0 vicryl pursestring suture was placed into the fascia. The hasson trocar was advanced into the abdomen and pneumoperitoneum was achieved to a pressure of 15 mm Hg.   Three additional 5 mm trocars were placed in the RUQ and epigastric region. The cyst was identified. The harmonic scalpel was used to enter the cyst. The fluid  was suctioned out. The harmonic scalpel was used to take off the top of the cyst. The cyst was adherent to the diaphragm and was peeled off.  The calcified area was resected with the harmonic scalpel with the cyst wall.  The Echelon stapler was used to divide a portion of the thicker liver cyst wall.   The SNOW hemostatic agent was placed against the posterior cyst wall. Hemostasis was achieved with cautery. There was a small amount of biliary leakage posteriorly at a portion that had been taken with the harmonic. This was stapled.  Several clips were placed to reinforce the staple line.  A 4 quadrant inspection was negative for evidence of bleeding or gross pathology.   A 19 Fr Blake drain was placed through the lateral most port and secured with a 2-0 nylon.  The 5 mm trocars were removed. The pneumoperitoneum was evacuated. The pursestring suture was tied down. There was no residual palpable fascial defect. The skin was closed with 4-0 monocryl in subcuticular fashion. Needle, sponge, and instrument counts were correct times 2.   The patient was taken to the PACU in stable condition.

## 2014-12-26 NOTE — Interval H&P Note (Signed)
History and Physical Interval Note:  12/26/2014 1:04 PM  Margaret Wilkinson  has presented today for surgery, with the diagnosis of LARGE LIVER CYST  The various methods of treatment have been discussed with the patient and family. After consideration of risks, benefits and other options for treatment, the patient has consented to  Procedure(s): LAPAROSCOPIC LIVER CYST UNROOFING (N/A) as a surgical intervention .  The patient's history has been reviewed, patient examined, no change in status, stable for surgery.  I have reviewed the patient's chart and labs.  Questions were answered to the patient's satisfaction.     [name]

## 2014-12-26 NOTE — Transfer of Care (Signed)
Immediate Anesthesia Transfer of Care Note  Patient: Margaret Wilkinson  Procedure(s) Performed: Procedure(s): LAPAROSCOPIC LIVER CYST UNROOFING (N/A)  Patient Location: PACU  Anesthesia Type:General  Level of Consciousness: awake, sedated, patient cooperative and responds to stimulation  Airway & Oxygen Therapy: Patient Spontanous Breathing and Patient connected to face mask oxygen  Post-op Assessment: Report given to RN, Post -op Vital signs reviewed and stable, Patient moving all extremities and Patient moving all extremities X 4  Post vital signs: Reviewed and stable  Last Vitals:  Filed Vitals:   12/26/14 1116  BP: 162/68  Pulse: 65  Temp: 36.7 C  Resp: 20    Complications: No apparent anesthesia complications

## 2014-12-26 NOTE — Anesthesia Postprocedure Evaluation (Signed)
  Anesthesia Post-op Note  Patient: Margaret Wilkinson  Procedure(s) Performed: Procedure(s) (LRB): LAPAROSCOPIC LIVER CYST UNROOFING (N/A)  Patient Location: PACU  Anesthesia Type: General  Level of Consciousness: awake and alert   Airway and Oxygen Therapy: Patient Spontanous Breathing  Post-op Pain: mild  Post-op Assessment: Post-op Vital signs reviewed, Patient's Cardiovascular Status Stable, Respiratory Function Stable, Patent Airway and No signs of Nausea or vomiting  Last Vitals:  Filed Vitals:   12/26/14 1809  BP: 142/56  Pulse: 70  Temp: 36.6 C  Resp: 18    Post-op Vital Signs: stable   Complications: No apparent anesthesia complications

## 2014-12-26 NOTE — H&P (Signed)
Margaret Wilkinson 12/02/2014 11:23 AM Location: [facility] Patient #: [id number] DOB: 16-Feb-1946 Widowed / Language: [place] / Race: White Female  History of Present Illness Almond Lint MD; 12/02/2014 12:14 PM) Patient words: Eval. hepatic cyst.  The patient is a 69 year old female who presents with a liver mass. Pt is referred by Dr. Nathanial Rancher for consultation regarding multiple hepatic cysts. She has been having abdominal pain over the last 4-6 weeks. It has been worsening. At first she thought she pulled a muscle but it did not resolve. She describes it as a dull ache that is present most of the time with periodic worsening. These were known about at least as far back as 2002. They were extensively worked up and found to be benign. They were noted to be quite large and a CT scan of the chest that she had last year. However, that was done for chest and shoulder pain on the left side. Since they were benign, she did not receive any referral. She got an ultrasound of the abdomen performed in June when she discussed to the new abdominal pain with her primary care doctor. The cyst had been seen to have enlarged. There are pushing her liver over to the left. She is not having any fevers or chills. She does not have any symptoms of heart failure. She does have a family history of colon cancer in her mother. She is up-to-date on screening for this.   Other Problems Elease Hashimoto Spillers, CMA; 12/02/2014 11:23 AM) High blood pressure Migraine Headache  Past Surgical History Elease Hashimoto Spillers, CMA; 12/02/2014 11:23 AM) Gallbladder Surgery - Laparoscopic Hysterectomy (not due to cancer) - Complete  Diagnostic Studies History (Alisha Spillers, CMA; 12/02/2014 11:23 AM) Colonoscopy 5-10 years ago Mammogram within last year Pap Smear 1-5 years ago  Allergies Ethlyn Gallery, CMA; 12/02/2014 11:26 AM) No Known Drug Allergies07/03/2015  Medication History (Alisha Spillers, CMA;  12/02/2014 11:26 AM) Atenolol (  Tablet, Oral) Active. Lisinopril-Hydrochlorothiazide (20-12.5MG  Tablet, Oral) Active. Medications Reconciled  Social History Ethlyn Gallery, CMA; 12/02/2014 11:23 AM) Caffeine use Carbonated beverages, Coffee, Tea. No alcohol use No drug use Tobacco use Never smoker.  Family History Ethlyn Gallery, CMA; 12/02/2014 11:23 AM) Arthritis Father. Breast Cancer Family Members In General. Colon Cancer Mother. Hypertension Mother. Migraine Headache Mother, Sister. Seizure disorder Son.  Pregnancy / Birth History Ethlyn Gallery, CMA; 12/02/2014 11:23 AM) Age at menarche 12 years. Age of menopause 42-50 Gravida 2 Maternal age 67-20 Para 2  Review of Systems Elease Hashimoto Spillers CMA; 12/02/2014 11:23 AM) General Not Present- Appetite Loss, Chills, Fatigue, Fever, Night Sweats, Weight Gain and Weight Loss. Skin Not Present- Change in Wart/Mole, Dryness, Hives, Jaundice, New Lesions, Non-Healing Wounds, Rash and Ulcer. HEENT Present- Wears glasses/contact lenses. Not Present- Earache, Hearing Loss, Hoarseness, Nose Bleed, Oral Ulcers, Ringing in the Ears, Seasonal Allergies, Sinus Pain, Sore Throat, Visual Disturbances and Yellow Eyes. Respiratory Present- Chronic Cough. Not Present- Bloody sputum, Difficulty Breathing, Snoring and Wheezing. Breast Not Present- Breast Mass, Breast Pain, Nipple Discharge and Skin Changes. Cardiovascular Not Present- Chest Pain, Difficulty Breathing Lying Down, Leg Cramps, Palpitations, Rapid Heart Rate, Shortness of Breath and Swelling of Extremities. Gastrointestinal Not Present- Abdominal Pain, Bloating, Bloody Stool, Change in Bowel Habits, Chronic diarrhea, Constipation, Difficulty Swallowing, Excessive gas, Gets full quickly at meals, Hemorrhoids, Indigestion, Nausea, Rectal Pain and Vomiting. Female Genitourinary Present- Frequency. Not Present- Nocturia, Painful Urination, Pelvic Pain and  Urgency. Musculoskeletal Present- Joint Pain. Not Present- Back Pain, Joint Stiffness, Muscle Pain, Muscle  Weakness and Swelling of Extremities. Neurological Not Present- Decreased Memory, Fainting, Headaches, Numbness, Seizures, Tingling, Tremor, Trouble walking and Weakness. Psychiatric Not Present- Anxiety, Bipolar, Change in Sleep Pattern, Depression, Fearful and Frequent crying. Endocrine Not Present- Cold Intolerance, Excessive Hunger, Hair Changes, Heat Intolerance, Hot flashes and New Diabetes. Hematology Not Present- Easy Bruising, Excessive bleeding, Gland problems, HIV and Persistent Infections.   Vitals (Alisha Spillers CMA; 12/02/2014 11:25 AM) 12/02/2014 11:24 AM Weight: 173.8 lb Height: 61.5in Body Surface Area: 1.85 m Body Mass Index: 32.31 kg/m Temp.: 15F(Oral)  Pulse: 64 (Regular)  BP: 164/82 (Sitting, Left Arm, Standard)    Physical Exam Almond Lint MD; 12/02/2014 12:15 PM) General Mental Status-Alert. General Appearance-Consistent with stated age. Hydration-Well hydrated. Voice-Normal.  Head and Neck Head-normocephalic, atraumatic with no lesions or palpable masses. Trachea-midline. Thyroid Gland Characteristics - normal size and consistency.  Eye Eyeball - Bilateral-Extraocular movements intact. Sclera/Conjunctiva - Bilateral-No scleral icterus.  Chest and Lung Exam Chest and lung exam reveals -quiet, even and easy respiratory effort with no use of accessory muscles and on auscultation, normal breath sounds, no adventitious sounds and normal vocal resonance. Inspection Chest Wall - Normal. Back - normal.  Cardiovascular Cardiovascular examination reveals -normal heart sounds, regular rate and rhythm with no murmurs and normal pedal pulses bilaterally.  Abdomen Inspection Inspection of the abdomen reveals - No Hernias. Palpation/Percussion Palpation and Percussion of the abdomen reveal - Soft, Non Tender, No Rebound  tenderness and No Rigidity (guarding). Note: The liver edge is palpable almost down to the anterior superior iliac spine. It feels soft. Auscultation Auscultation of the abdomen reveals - Bowel sounds normal.  Neurologic Neurologic evaluation reveals -alert and oriented x 3 with no impairment of recent or remote memory. Mental Status-Normal.  Musculoskeletal Global Assessment -Note: no gross deformities.  Normal Exam - Left-Upper Extremity Strength Normal and Lower Extremity Strength Normal. Normal Exam - Right-Upper Extremity Strength Normal and Lower Extremity Strength Normal.  Lymphatic Head & Neck  General Head & Neck Lymphatics: Bilateral - Description - Normal. Axillary  General Axillary Region: Bilateral - Description - Normal. Tenderness - Non Tender. Femoral & Inguinal  Generalized Femoral & Inguinal Lymphatics: Bilateral - Description - No Generalized lymphadenopathy.    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 I will send the cyst wall for pathology as well as cyst fluid.  I discussed the surgery with the patient. I reviewed the incisions, the progression of surgery, and the anticipated postoperative course. I discussed that she would need to stay overnight in the hospital at least for 1 night. I discussed the risk of bleeding, infection, damage to adjacent structure, heart or lung complications, or death. I do need to get a CT scan on the patient to evaluate her hepatic and portal venous anatomy. We will do this at the first available opportunity. She is advised not to take aspirin within a week before surgery. Current Plans  Schedule for Surgery Pt Education - [facility] Free Text Education/Instructions: discussed with patient and provided information.   Signed by Almond Lint, MD (12/02/2014 12:16 PM)

## 2014-12-27 ENCOUNTER — Encounter (HOSPITAL_COMMUNITY): Payer: Self-pay | Admitting: General Surgery

## 2014-12-27 DIAGNOSIS — D72829 Elevated white blood cell count, unspecified: Secondary | ICD-10-CM | POA: Diagnosis not present

## 2014-12-27 DIAGNOSIS — I10 Essential (primary) hypertension: Secondary | ICD-10-CM | POA: Diagnosis not present

## 2014-12-27 DIAGNOSIS — K7689 Other specified diseases of liver: Secondary | ICD-10-CM | POA: Diagnosis not present

## 2014-12-27 DIAGNOSIS — Z79899 Other long term (current) drug therapy: Secondary | ICD-10-CM | POA: Diagnosis not present

## 2014-12-27 LAB — COMPREHENSIVE METABOLIC PANEL
ALBUMIN: 3.1 g/dL — AB (ref 3.5–5.0)
ALT: 67 U/L — ABNORMAL HIGH (ref 14–54)
AST: 117 U/L — ABNORMAL HIGH (ref 15–41)
Alkaline Phosphatase: 62 U/L (ref 38–126)
Anion gap: 10 (ref 5–15)
BUN: 13 mg/dL (ref 6–20)
CO2: 23 mmol/L (ref 22–32)
Calcium: 8.1 mg/dL — ABNORMAL LOW (ref 8.9–10.3)
Chloride: 105 mmol/L (ref 101–111)
Creatinine, Ser: 1.14 mg/dL — ABNORMAL HIGH (ref 0.44–1.00)
GFR calc Af Amer: 56 mL/min — ABNORMAL LOW (ref >60)
GFR calc non Af Amer: 48 mL/min — ABNORMAL LOW (ref >60)
GLUCOSE: 232 mg/dL — AB (ref 65–99)
POTASSIUM: 4.4 mmol/L (ref 3.5–5.1)
SODIUM: 138 mmol/L (ref 135–145)
Total Bilirubin: 0.7 mg/dL (ref 0.3–1.2)
Total Protein: 5.4 g/dL — ABNORMAL LOW (ref 6.5–8.1)

## 2014-12-27 LAB — CBC
HEMATOCRIT: 42.6 % (ref 36.0–46.0)
Hemoglobin: 14 g/dL (ref 12.0–15.0)
MCH: 27.2 pg (ref 26.0–34.0)
MCHC: 32.9 g/dL (ref 30.0–36.0)
MCV: 82.7 fL (ref 78.0–100.0)
PLATELETS: 225 10*3/uL (ref 150–400)
RBC: 5.15 MIL/uL — AB (ref 3.87–5.11)
RDW: 14.4 % (ref 11.5–15.5)
WBC: 20.8 10*3/uL — ABNORMAL HIGH (ref 4.0–10.5)

## 2014-12-27 MED ORDER — HYDROMORPHONE HCL 1 MG/ML IJ SOLN: 0.5000 mg | INTRAMUSCULAR | Status: DC | PRN

## 2014-12-27 NOTE — Progress Notes (Signed)
1 Day Post-Op  Subjective: Nausea with morphine.  No emesis.    Objective: Vital signs in last 24 hours: Temp:  [97.6 F (36.4 C)-98.1 F (36.7 C)] 98.1 F (36.7 C) (08/05 0600) Pulse Rate:  [61-78] 68 (08/05 0600) Resp:  [14-20] 20 (08/05 0600) BP: (118-162)/(50-68) 130/50 mmHg (08/05 0600) SpO2:  [92 %-99 %] 94 % (08/05 0600) Weight:  [78.472 kg (173 lb)] 78.472 kg (173 lb) (08/04 1116) Last BM Date: 12/26/14  Intake/Output from previous day: 08/04 0701 - 08/05 0700 In: 2738.3 [I.V.:2738.3] Out: 575 [Drains:175; Blood:400] Intake/Output this shift:    General appearance: alert, cooperative and mild distress Resp: breathing comfortably GI: soft, non distended, approp tender.  Drain looks like old blood.    Lab Results:   Recent Labs  12/25/14 1445 12/27/14 0356  WBC 10.6* 20.8*  HGB 14.4 14.0  HCT 43.2 42.6  PLT 213 225   BMET  Recent Labs  12/25/14 1445 12/27/14 0356  NA 140 138  K 3.4* 4.4  CL 102 105  CO2 29 23  GLUCOSE 108* 232*  BUN 16 13  CREATININE 0.88 1.14*  CALCIUM 9.0 8.1*   PT/INR  Recent Labs  12/25/14 1445  LABPROT 13.5  INR 1.01   ABG No results for input(s): PHART, HCO3 in the last 72 hours.  Invalid input(s): PCO2, PO2  Studies/Results: No results found.  Anti-infectives: Anti-infectives    Start     Dose/Rate Route Frequency Ordered Stop   12/26/14 2200  ceFAZolin (ANCEF) IVPB 2 g/50 mL premix     2 g 100 mL/hr over 30 Minutes Intravenous 3 times per day 12/26/14 1754 12/26/14 2137   12/26/14 1150  ceFAZolin (ANCEF) 2-3 GM-% IVPB SOLR    Comments:  Ray Church   : cabinet override      12/26/14 1150 12/26/14 2359   12/26/14 1115  ceFAZolin (ANCEF) IVPB 2 g/50 mL premix     2 g 100 mL/hr over 30 Minutes Intravenous On call to O.R. 12/26/14 1115 12/26/14 1334      Assessment/Plan: s/p Procedure(s): LAPAROSCOPIC LIVER CYST UNROOFING (N/A) will plan to leave drain  Needs to stay another day given  nausea. Will change to dilaudid Decrease IVF.     Ha Placeres 12/27/2014

## 2014-12-27 NOTE — Plan of Care (Signed)
Problem: Phase I Progression Outcomes Goal: Tubes/drains patent Outcome: Completed/Met Date Met:  12/27/14 Drainage is slowing down

## 2014-12-27 NOTE — Plan of Care (Signed)
Problem: Phase I Progression Outcomes Goal: Pain controlled with appropriate interventions Outcome: Progressing Doing better since Morphine was discontinued.

## 2014-12-28 DIAGNOSIS — I10 Essential (primary) hypertension: Secondary | ICD-10-CM | POA: Diagnosis not present

## 2014-12-28 DIAGNOSIS — Z79899 Other long term (current) drug therapy: Secondary | ICD-10-CM | POA: Diagnosis not present

## 2014-12-28 DIAGNOSIS — D72829 Elevated white blood cell count, unspecified: Secondary | ICD-10-CM | POA: Diagnosis not present

## 2014-12-28 DIAGNOSIS — K7689 Other specified diseases of liver: Secondary | ICD-10-CM | POA: Diagnosis not present

## 2014-12-28 LAB — COMPREHENSIVE METABOLIC PANEL
ALT: 36 U/L (ref 14–54)
ANION GAP: 6 (ref 5–15)
AST: 40 U/L (ref 15–41)
Albumin: 2.6 g/dL — ABNORMAL LOW (ref 3.5–5.0)
Alkaline Phosphatase: 56 U/L (ref 38–126)
BILIRUBIN TOTAL: 0.6 mg/dL (ref 0.3–1.2)
BUN: 16 mg/dL (ref 6–20)
CALCIUM: 8 mg/dL — AB (ref 8.9–10.3)
CHLORIDE: 105 mmol/L (ref 101–111)
CO2: 28 mmol/L (ref 22–32)
Creatinine, Ser: 1.14 mg/dL — ABNORMAL HIGH (ref 0.44–1.00)
GFR calc Af Amer: 56 mL/min — ABNORMAL LOW (ref >60)
GFR, EST NON AFRICAN AMERICAN: 48 mL/min — AB (ref >60)
GLUCOSE: 131 mg/dL — AB (ref 65–99)
POTASSIUM: 4.4 mmol/L (ref 3.5–5.1)
Sodium: 139 mmol/L (ref 135–145)
TOTAL PROTEIN: 4.7 g/dL — AB (ref 6.5–8.1)

## 2014-12-28 LAB — CBC
HCT: 37.9 % (ref 36.0–46.0)
HEMOGLOBIN: 12.1 g/dL (ref 12.0–15.0)
MCH: 27 pg (ref 26.0–34.0)
MCHC: 31.9 g/dL (ref 30.0–36.0)
MCV: 84.6 fL (ref 78.0–100.0)
Platelets: 179 10*3/uL (ref 150–400)
RBC: 4.48 MIL/uL (ref 3.87–5.11)
RDW: 15.1 % (ref 11.5–15.5)
WBC: 18.7 10*3/uL — AB (ref 4.0–10.5)

## 2014-12-28 MED ORDER — DOCUSATE SODIUM 100 MG PO CAPS: 100.0000 mg | ORAL_CAPSULE | Freq: Two times a day (BID) | ORAL | Status: DC | PRN

## 2014-12-28 MED ORDER — SIMETHICONE 80 MG PO CHEW: 40.0000 mg | CHEWABLE_TABLET | Freq: Four times a day (QID) | ORAL | Status: DC | PRN

## 2014-12-28 MED ORDER — IBUPROFEN 600 MG PO TABS: 600.0000 mg | ORAL_TABLET | Freq: Four times a day (QID) | ORAL | Status: DC | PRN

## 2014-12-28 MED ORDER — ONDANSETRON 4 MG PO TBDP: 4.0000 mg | ORAL_TABLET | Freq: Four times a day (QID) | ORAL | Status: DC | PRN

## 2014-12-28 MED ORDER — OXYCODONE HCL 5 MG PO TABS: 5.0000 mg | ORAL_TABLET | Freq: Four times a day (QID) | ORAL | Status: DC | PRN

## 2014-12-28 NOTE — Progress Notes (Signed)
[facility] Progress Note  2 Days Post-Op  Subjective: Pt doing well, no nausea since lunch yesterday.  Had a good dinner last night.  Ambulating well unassisted in room.  Having flatus.  Urinating well, but no BM yet.  Says she was coughing up some phlegm with blood tinges, but no clots etc.   Objective: Vital signs in last 24 hours: Temp:  [97.5 F (36.4 C)-99.1 F (37.3 C)] 97.5 F (36.4 C) (08/06 0147) Pulse Rate:  [70-79] 70 (08/06 0147) Resp:  [18-20] 19 (08/06 0147) BP: (105-123)/(37-45) 105/37 mmHg (08/06 0147) SpO2:  [92 %-98 %] 98 % (08/06 0147) Last BM Date: 12/26/14  Intake/Output from previous day: 08/05 0701 - 08/06 0700 In: 3078.3 [P.O.:820; I.V.:2258.3] Out: 1110 [Urine:800; Drains:310] Intake/Output this shift:    PE: Gen:  Alert, NAD, pleasant Abd: Soft, minimal tenderness, ND, +BS, no HSM, incisions C/D/I, drain with sanguinous drainage (339mL/24hr)   Lab Results:   Recent Labs  12/27/14 0356 12/28/14 0316  WBC 20.8* 18.7*  HGB 14.0 12.1  HCT 42.6 37.9  PLT 225 179   BMET  Recent Labs  12/27/14 0356 12/28/14 0316  NA 138 139  K 4.4 4.4  CL 105 105  CO2 23 28  GLUCOSE 232* 131*  BUN 13 16  CREATININE 1.14* 1.14*  CALCIUM 8.1* 8.0*   PT/INR  Recent Labs  12/25/14 1445  LABPROT 13.5  INR 1.01   CMP     Component Value Date/Time   NA 139 12/28/2014 0316   K 4.4 12/28/2014 0316   CL 105 12/28/2014 0316   CO2 28 12/28/2014 0316   GLUCOSE 131* 12/28/2014 0316   BUN 16 12/28/2014 0316   CREATININE 1.14* 12/28/2014 0316   CALCIUM 8.0* 12/28/2014 0316   PROT 4.7* 12/28/2014 0316   ALBUMIN 2.6* 12/28/2014 0316   AST 40 12/28/2014 0316   ALT 36 12/28/2014 0316   ALKPHOS 56 12/28/2014 0316   BILITOT 0.6 12/28/2014 0316   GFRNONAA 48* 12/28/2014 0316   GFRAA 56* 12/28/2014 0316   Lipase  No results found for: LIPASE     Studies/Results: No results found.  Anti-infectives: Anti-infectives    Start      Dose/Rate Route Frequency Ordered Stop   12/26/14 2200  ceFAZolin (ANCEF) IVPB 2 g/50 mL premix     2 g 100 mL/hr over 30 Minutes Intravenous 3 times per day 12/26/14 1754 12/26/14 2137   12/26/14 1150  ceFAZolin (ANCEF) 2-3 GM-% IVPB SOLR    Comments:  Ray Church   : cabinet override      12/26/14 1150 12/26/14 2359   12/26/14 1115  ceFAZolin (ANCEF) IVPB 2 g/50 mL premix     2 g 100 mL/hr over 30 Minutes Intravenous On call to O.R. 12/26/14 1115 12/26/14 1334       Assessment/Plan Liver cyst POD #2 s/p laparoscopic liver cyst unroofing and drain placement -LFT's okay today, continue drain ( sanguinous) -Pain control, antiemetics -Ambulate and IS -SCD's and not currently on pharm dvt proph -Reg diet  Leukocytosis - 18.7 improved today Disp- can likely d/c home today after ambulating well      Nonie Hoyer 12/28/2014, 7:59 AM Pager: [phone]

## 2014-12-28 NOTE — Care Management Note (Signed)
Case Management Note  Patient Details  Name: Margaret Wilkinson MRN: [id number] Date of Birth: 12/25/1945  Subjective/Objective:                    Action/Plan:   Expected Discharge Date:                  Expected Discharge Plan:     In-House Referral:     Discharge planning Services     Post Acute Care Choice:    Choice offered to:     DME Arranged:    DME Agency:     HH Arranged:    HH Agency:     Status of Service:     Medicare Important Message Given:   yes Date Medicare IM Given:   12/28/14 Medicare IM give by:   Meryl Crutch, RN, BSN Date Additional Medicare IM Given:    Additional Medicare Important Message give by:     If discussed at Long Length of Stay Meetings, dates discussed:    Additional Comments:  Isaias Cowman, RN 12/28/2014, 3:25 PM

## 2014-12-29 DIAGNOSIS — I10 Essential (primary) hypertension: Secondary | ICD-10-CM | POA: Diagnosis not present

## 2014-12-29 DIAGNOSIS — D72829 Elevated white blood cell count, unspecified: Secondary | ICD-10-CM | POA: Diagnosis not present

## 2014-12-29 DIAGNOSIS — Z79899 Other long term (current) drug therapy: Secondary | ICD-10-CM | POA: Diagnosis not present

## 2014-12-29 DIAGNOSIS — K7689 Other specified diseases of liver: Secondary | ICD-10-CM | POA: Diagnosis not present

## 2014-12-29 LAB — COMPREHENSIVE METABOLIC PANEL
ALBUMIN: 2.3 g/dL — AB (ref 3.5–5.0)
ALT: 25 U/L (ref 14–54)
ANION GAP: 5 (ref 5–15)
AST: 22 U/L (ref 15–41)
Alkaline Phosphatase: 71 U/L (ref 38–126)
BILIRUBIN TOTAL: 1 mg/dL (ref 0.3–1.2)
BUN: 7 mg/dL (ref 6–20)
CALCIUM: 7.7 mg/dL — AB (ref 8.9–10.3)
CO2: 25 mmol/L (ref 22–32)
Chloride: 104 mmol/L (ref 101–111)
Creatinine, Ser: 0.87 mg/dL (ref 0.44–1.00)
GFR calc Af Amer: >60 mL/min (ref >60)
Glucose, Bld: 121 mg/dL — ABNORMAL HIGH (ref 65–99)
Potassium: 4.7 mmol/L (ref 3.5–5.1)
Sodium: 134 mmol/L — ABNORMAL LOW (ref 135–145)
Total Protein: 4.6 g/dL — ABNORMAL LOW (ref 6.5–8.1)

## 2014-12-29 LAB — CBC
HCT: 38.6 % (ref 36.0–46.0)
Hemoglobin: 12.2 g/dL (ref 12.0–15.0)
MCH: 26.8 pg (ref 26.0–34.0)
MCHC: 31.6 g/dL (ref 30.0–36.0)
MCV: 84.8 fL (ref 78.0–100.0)
PLATELETS: 186 10*3/uL (ref 150–400)
RBC: 4.55 MIL/uL (ref 3.87–5.11)
RDW: 15 % (ref 11.5–15.5)
WBC: 17.5 10*3/uL — AB (ref 4.0–10.5)

## 2014-12-29 MED ORDER — POLYETHYLENE GLYCOL 3350 17 G PO PACK
17.0000 g | PACK | Freq: Every day | ORAL | Status: DC
  Administered 2014-12-29 – 2014-12-30 (×2): 17 g via ORAL
  Filled 2014-12-29 (×2): qty 1

## 2014-12-29 NOTE — Progress Notes (Signed)
Patient ate 1/2 lunch today, but has no ride home.

## 2014-12-29 NOTE — Progress Notes (Signed)
[facility] Progress Note  3 Days Post-Op  Subjective: Pt c/o anorexia and not liking the food here.  No N/V, ambulating well in halls.  Pain better controlled.  Having flatus, last BM 12/26/14.  Drain picked up output.  Objective: Vital signs in last 24 hours: Temp:  [98.8 F (37.1 C)-98.9 F (37.2 C)] 98.9 F (37.2 C) (08/07 0514) Pulse Rate:  [88-107] 100 (08/07 0514) Resp:  [18-19] 19 (08/07 0514) BP: (124-133)/(48-51) 124/48 mmHg (08/07 0514) SpO2:  [95 %-96 %] 96 % (08/07 0514) Last BM Date: 12/26/14  Intake/Output from previous day: 08/06 0701 - 08/07 0700 In: -  Out: 835 [Urine:240; Drains:520; Stool:75] Intake/Output this shift:    PE: Gen:  Alert, NAD, pleasant Abd: Soft, NT/ND, +BS, no HSM, incisions C/D/I, drain with sanguinous drainage currently, but nurses note bilious output when they drain it (output up to )   Lab Results:   Recent Labs  12/28/14 0316 12/29/14 0520  WBC 18.7* 17.5*  HGB 12.1 12.2  HCT 37.9 38.6  PLT 179 186   BMET  Recent Labs  12/28/14 0316 12/29/14 0520  NA 139 134*  K 4.4 4.7  CL 105 104  CO2 28 25  GLUCOSE 131* 121*  BUN 16 7  CREATININE 1.14* 0.87  CALCIUM 8.0* 7.7*   PT/INR No results for input(s): LABPROT, INR in the last 72 hours. CMP     Component Value Date/Time   NA 134* 12/29/2014 0520   K 4.7 12/29/2014 0520   CL 104 12/29/2014 0520   CO2 25 12/29/2014 0520   GLUCOSE 121* 12/29/2014 0520   BUN 7 12/29/2014 0520   CREATININE 0.87 12/29/2014 0520   CALCIUM 7.7* 12/29/2014 0520   PROT 4.6* 12/29/2014 0520   ALBUMIN 2.3* 12/29/2014 0520   AST 22 12/29/2014 0520   ALT 25 12/29/2014 0520   ALKPHOS 71 12/29/2014 0520   BILITOT 1.0 12/29/2014 0520   GFRNONAA >60 12/29/2014 0520   GFRAA >60 12/29/2014 0520   Lipase  No results found for: LIPASE     Studies/Results: No results found.  Anti-infectives: Anti-infectives    Start     Dose/Rate Route Frequency Ordered Stop   12/26/14 2200  ceFAZolin (ANCEF) IVPB 2 g/50 mL premix     2 g 100 mL/hr over 30 Minutes Intravenous 3 times per day 12/26/14 1754 12/26/14 2137   12/26/14 1150  ceFAZolin (ANCEF) 2-3 GM-% IVPB SOLR    Comments:  Ray Church   : cabinet override      12/26/14 1150 12/26/14 2359   12/26/14 1115  ceFAZolin (ANCEF) IVPB 2 g/50 mL premix     2 g 100 mL/hr over 30 Minutes Intravenous On call to O.R. 12/26/14 1115 12/26/14 1334       Assessment/Plan Liver cyst POD #3 s/p laparoscopic liver cyst unroofing and drain placement -LFT's okay today, continue drain, had increase from yesterday ( sanguinous) -Pain control, antiemetics -Ambulate and IS -SCD's and not currently on pharm dvt proph -Reg diet, but anorexic -Colace and miralax  Leukocytosis - 17.5 improved today Disp- Possible D/c today or tomorrow       Nonie Hoyer 12/29/2014, 7:50 AM Pager: [phone]

## 2014-12-30 DIAGNOSIS — Z79899 Other long term (current) drug therapy: Secondary | ICD-10-CM | POA: Diagnosis not present

## 2014-12-30 DIAGNOSIS — I10 Essential (primary) hypertension: Secondary | ICD-10-CM | POA: Diagnosis not present

## 2014-12-30 DIAGNOSIS — K7689 Other specified diseases of liver: Secondary | ICD-10-CM | POA: Diagnosis not present

## 2014-12-30 DIAGNOSIS — D72829 Elevated white blood cell count, unspecified: Secondary | ICD-10-CM | POA: Diagnosis not present

## 2014-12-30 MED ORDER — POLYETHYLENE GLYCOL 3350 17 G PO PACK: 17.0000 g | PACK | Freq: Every day | ORAL | Status: DC

## 2014-12-30 NOTE — Discharge Summary (Signed)
Physician Discharge Summary  Patient ID: Margaret Wilkinson MRN: [id number] DOB/AGE: July 18, 1945 69 y.o.  Admit date: 12/26/2014 Discharge date: 12/30/2014  Admission Diagnoses: Liver cyst  Discharge Diagnoses:  Active Problems:   Liver cyst   Discharged Condition: stable  Hospital Course:  Patient was admitted to the floor following laparoscopic liver cyst unroofing with resection of calcified portion.  She had a fair amount of nausea for several days.  She also developed some bilious output in the JP.  She was able to have her diet advanced.  She was able to void and ambulate independently.  She was discharged to home with the drain.   Consults: None  Significant Diagnostic Studies: labs: see epic   Treatments: surgery: see above.   Discharge Exam: Blood pressure 107/52, pulse 67, temperature 99.2 F (37.3 C), temperature source Oral, resp. rate 16, height 5\' 2"  (1.575 m), weight 78.472 kg (173 lb), SpO2 94 %. General appearance: alert, cooperative and no distress Resp: breathing comfortbaly GI: soft, non distended, non tender.  drain serosang/bile stained.    Disposition:   Discharge Instructions    Change dressing (specify)    Complete by:  As directed   Measure and record drain output at least twice daily.  Bring record to clinic.     Diet - low sodium heart healthy    Complete by:  As directed      Increase activity slowly    Complete by:  As directed             Medication List    TAKE these medications        atenolol 50 MG tablet  Commonly known as:  TENORMIN  Take 50 mg by mouth daily.     CALCIUM 600 + D PO  Take 1 tablet by mouth daily.     cholecalciferol 1000 UNITS tablet  Commonly known as:  VITAMIN D  Take 1,000 Units by mouth daily.     docusate sodium 100 MG capsule  Commonly known as:  COLACE  Take 1 capsule (100 mg total) by mouth 2 (two) times daily as needed for mild constipation.     ibuprofen 600 MG tablet  Commonly known as:   ADVIL,MOTRIN  Take 1 tablet (600 mg total) by mouth every 6 (six) hours as needed for mild pain.     lisinopril-hydrochlorothiazide 20-12.5 MG per tablet  Commonly known as:  PRINZIDE,ZESTORETIC  Take 1 tablet by mouth daily.     multivitamin with minerals tablet  Take 1 tablet by mouth daily.     ondansetron 4 MG disintegrating tablet  Commonly known as:  ZOFRAN-ODT  Take 1 tablet (4 mg total) by mouth every 6 (six) hours as needed for nausea.     oxyCODONE 5 MG immediate release tablet  Commonly known as:  Oxy IR/ROXICODONE  Take 1-2 tablets (5-10 mg total) by mouth every 6 (six) hours as needed for moderate pain or severe pain.     polyethylene glycol packet  Commonly known as:  MIRALAX / GLYCOLAX  Take 17 g by mouth daily.     rizatriptan 10 MG tablet  Commonly known as:  MAXALT  Take 10 mg by mouth as needed for migraine. May repeat in 2 hours if needed     simethicone 80 MG chewable tablet  Commonly known as:  MYLICON  Chew 0.5 tablets (40 mg total) by mouth every 6 (six) hours as needed for flatulence (bloating).  Follow-up Information    Follow up with [facility], MD In 1 week.   Specialty:  General Surgery   Contact information:   [address] [place] [address] [phone]       Signed: Almond Lint 12/30/2014, 8:51 AM

## 2014-12-30 NOTE — Progress Notes (Signed)
Margaret Wilkinson to be D/C'd Home per MD order.  Discussed with the patient and all questions fully answered.  VSS, Skin clean, dry and intact without evidence of skin break down, no evidence of skin tears noted. IV catheter discontinued intact. Site without signs and symptoms of complications. Dressing and pressure applied.  An After Visit Summary was printed and given to the patient. Patient received prescriptions.  D/c education completed with patient/family including follow up instructions, medication list, d/c activities limitations if indicated, with other d/c instructions as indicated by MD - patient able to verbalize understanding, all questions fully answered.   Patient instructed to return to ED, call 911, or call MD for any changes in condition.   Patient escorted via WC, and D/C home via private auto.  Margaret Wilkinson 12/30/2014 1050

## 2015-01-21 ENCOUNTER — Other Ambulatory Visit: Payer: Self-pay

## 2015-01-21 ENCOUNTER — Other Ambulatory Visit: Payer: Self-pay | Admitting: General Surgery

## 2015-01-21 DIAGNOSIS — Q446 Cystic disease of liver: Secondary | ICD-10-CM

## 2015-01-21 DIAGNOSIS — Z9889 Other specified postprocedural states: Principal | ICD-10-CM

## 2015-01-21 DIAGNOSIS — R112 Nausea with vomiting, unspecified: Secondary | ICD-10-CM

## 2015-01-21 NOTE — Addendum Note (Signed)
Addended by: Almond Lint on: 01/21/2015 10:58 AM   Modules accepted: Orders

## 2015-01-23 ENCOUNTER — Ambulatory Visit
Admission: RE | Admit: 2015-01-23 | Discharge: 2015-01-23 | Disposition: A | Discharge status: Home / Self Care | Payer: Medicare Other | Source: Ambulatory Visit | Attending: General Surgery | Admitting: General Surgery

## 2015-01-23 DIAGNOSIS — Z9889 Other specified postprocedural states: Principal | ICD-10-CM

## 2015-01-23 DIAGNOSIS — K7689 Other specified diseases of liver: Secondary | ICD-10-CM | POA: Diagnosis not present

## 2015-01-23 DIAGNOSIS — R188 Other ascites: Secondary | ICD-10-CM | POA: Diagnosis not present

## 2015-01-23 DIAGNOSIS — R112 Nausea with vomiting, unspecified: Secondary | ICD-10-CM

## 2015-01-23 MED ORDER — IOHEXOL 300 MG/ML  SOLN
30.0000 mL | Freq: Once | INTRAMUSCULAR | Status: AC | PRN
  Administered 2015-01-23: 30 mL via ORAL

## 2015-01-23 MED ORDER — IOPAMIDOL (ISOVUE-300) INJECTION 61%
100.0000 mL | Freq: Once | INTRAVENOUS | Status: AC | PRN
  Administered 2015-01-23: 100 mL via INTRAVENOUS

## 2015-01-24 ENCOUNTER — Other Ambulatory Visit: Payer: Self-pay | Admitting: General Surgery

## 2015-01-24 DIAGNOSIS — T814XXA Infection following a procedure, initial encounter: Secondary | ICD-10-CM

## 2015-01-24 DIAGNOSIS — IMO0001 Reserved for inherently not codable concepts without codable children: Secondary | ICD-10-CM

## 2015-01-24 DIAGNOSIS — J90 Pleural effusion, not elsewhere classified: Secondary | ICD-10-CM

## 2015-01-24 NOTE — Progress Notes (Signed)
Quick Note:  Please let patient know that there is a small amount of fluid near the top of her liver and some fluid in her lungs. I have written orders for IR to sample these areas of fluid collection. Can you schedule>? tx FB ______

## 2015-01-29 ENCOUNTER — Ambulatory Visit (HOSPITAL_COMMUNITY)
Admission: RE | Admit: 2015-01-29 | Discharge: 2015-01-29 | Disposition: A | Discharge status: Home / Self Care | Payer: Medicare Other | Source: Ambulatory Visit | Attending: General Surgery | Admitting: General Surgery

## 2015-01-29 ENCOUNTER — Ambulatory Visit (HOSPITAL_COMMUNITY)
Admission: RE | Admit: 2015-01-29 | Discharge: 2015-01-29 | Disposition: A | Discharge status: Home / Self Care | Payer: Medicare Other | Source: Ambulatory Visit | Attending: Radiology | Admitting: Radiology

## 2015-01-29 DIAGNOSIS — Z9889 Other specified postprocedural states: Secondary | ICD-10-CM

## 2015-01-29 DIAGNOSIS — J90 Pleural effusion, not elsewhere classified: Secondary | ICD-10-CM | POA: Diagnosis not present

## 2015-01-29 DIAGNOSIS — J948 Other specified pleural conditions: Secondary | ICD-10-CM | POA: Diagnosis not present

## 2015-01-29 DIAGNOSIS — Z09 Encounter for follow-up examination after completed treatment for conditions other than malignant neoplasm: Secondary | ICD-10-CM | POA: Diagnosis not present

## 2015-01-29 NOTE — Procedures (Signed)
Successful US guided right thoracentesis. Yielded 800 ml of serous fluid. Pt tolerated procedure well. No immediate complications.  Specimen was sent for labs. CXR ordered.  Pattricia Boss D PA-C 01/29/2015 2:47 PM

## 2015-01-31 LAB — TOTAL BILIRUBIN, BODY FLUID: Total bilirubin, fluid: 0.3 mg/dL

## 2015-02-04 DIAGNOSIS — R5383 Other fatigue: Secondary | ICD-10-CM | POA: Diagnosis not present

## 2015-02-04 DIAGNOSIS — Z6829 Body mass index (BMI) 29.0-29.9, adult: Secondary | ICD-10-CM | POA: Diagnosis not present

## 2015-03-06 ENCOUNTER — Other Ambulatory Visit: Payer: Self-pay

## 2015-03-06 DIAGNOSIS — Z1231 Encounter for screening mammogram for malignant neoplasm of breast: Secondary | ICD-10-CM

## 2015-03-14 DIAGNOSIS — Z23 Encounter for immunization: Secondary | ICD-10-CM | POA: Diagnosis not present

## 2015-03-15 DIAGNOSIS — R42 Dizziness and giddiness: Secondary | ICD-10-CM | POA: Diagnosis not present

## 2015-03-15 DIAGNOSIS — S01112A Laceration without foreign body of left eyelid and periocular area, initial encounter: Secondary | ICD-10-CM | POA: Diagnosis not present

## 2015-03-15 DIAGNOSIS — R404 Transient alteration of awareness: Secondary | ICD-10-CM | POA: Diagnosis not present

## 2015-03-15 DIAGNOSIS — Z79899 Other long term (current) drug therapy: Secondary | ICD-10-CM | POA: Diagnosis not present

## 2015-03-15 DIAGNOSIS — S0083XA Contusion of other part of head, initial encounter: Secondary | ICD-10-CM | POA: Diagnosis not present

## 2015-03-15 DIAGNOSIS — I10 Essential (primary) hypertension: Secondary | ICD-10-CM | POA: Diagnosis not present

## 2015-03-18 DIAGNOSIS — Z6830 Body mass index (BMI) 30.0-30.9, adult: Secondary | ICD-10-CM | POA: Diagnosis not present

## 2015-03-18 DIAGNOSIS — S0083XA Contusion of other part of head, initial encounter: Secondary | ICD-10-CM | POA: Diagnosis not present

## 2015-03-18 DIAGNOSIS — E876 Hypokalemia: Secondary | ICD-10-CM | POA: Diagnosis not present

## 2015-03-18 DIAGNOSIS — R42 Dizziness and giddiness: Secondary | ICD-10-CM | POA: Diagnosis not present

## 2015-03-18 DIAGNOSIS — W19XXXA Unspecified fall, initial encounter: Secondary | ICD-10-CM | POA: Diagnosis not present

## 2015-04-02 ENCOUNTER — Ambulatory Visit: Payer: Medicare Other

## 2015-04-10 ENCOUNTER — Other Ambulatory Visit: Payer: Self-pay | Admitting: Family Medicine

## 2015-04-10 DIAGNOSIS — M8580 Other specified disorders of bone density and structure, unspecified site: Secondary | ICD-10-CM

## 2015-04-14 ENCOUNTER — Ambulatory Visit
Admission: RE | Admit: 2015-04-14 | Discharge: 2015-04-14 | Disposition: A | Discharge status: Home / Self Care | Payer: Medicare Other | Source: Ambulatory Visit

## 2015-04-14 DIAGNOSIS — Z1231 Encounter for screening mammogram for malignant neoplasm of breast: Secondary | ICD-10-CM

## 2015-05-05 DIAGNOSIS — M25569 Pain in unspecified knee: Secondary | ICD-10-CM | POA: Diagnosis not present

## 2015-05-05 DIAGNOSIS — E559 Vitamin D deficiency, unspecified: Secondary | ICD-10-CM | POA: Diagnosis not present

## 2015-05-05 DIAGNOSIS — M6281 Muscle weakness (generalized): Secondary | ICD-10-CM | POA: Diagnosis not present

## 2015-05-05 DIAGNOSIS — G43909 Migraine, unspecified, not intractable, without status migrainosus: Secondary | ICD-10-CM | POA: Diagnosis not present

## 2015-05-05 DIAGNOSIS — I10 Essential (primary) hypertension: Secondary | ICD-10-CM | POA: Diagnosis not present

## 2015-06-02 ENCOUNTER — Other Ambulatory Visit: Payer: Medicare Other

## 2015-07-02 ENCOUNTER — Ambulatory Visit
Admission: RE | Admit: 2015-07-02 | Discharge: 2015-07-02 | Disposition: A | Discharge status: Home / Self Care | Payer: Medicare Other | Source: Ambulatory Visit | Attending: Family Medicine | Admitting: Family Medicine

## 2015-07-02 DIAGNOSIS — M85851 Other specified disorders of bone density and structure, right thigh: Secondary | ICD-10-CM | POA: Diagnosis not present

## 2015-07-02 DIAGNOSIS — M8580 Other specified disorders of bone density and structure, unspecified site: Secondary | ICD-10-CM

## 2015-07-11 DIAGNOSIS — H2513 Age-related nuclear cataract, bilateral: Secondary | ICD-10-CM | POA: Diagnosis not present

## 2015-08-25 DIAGNOSIS — M1711 Unilateral primary osteoarthritis, right knee: Secondary | ICD-10-CM | POA: Diagnosis not present

## 2015-10-19 IMAGING — CR DG CHEST 1V
1 series · 1 of 1 positions shown · non-contrast
Comparison: January 06, 2014

CLINICAL DATA: Status post right-sided thoracentesis

EXAM:
CHEST  1 VIEW

[w chest pa]
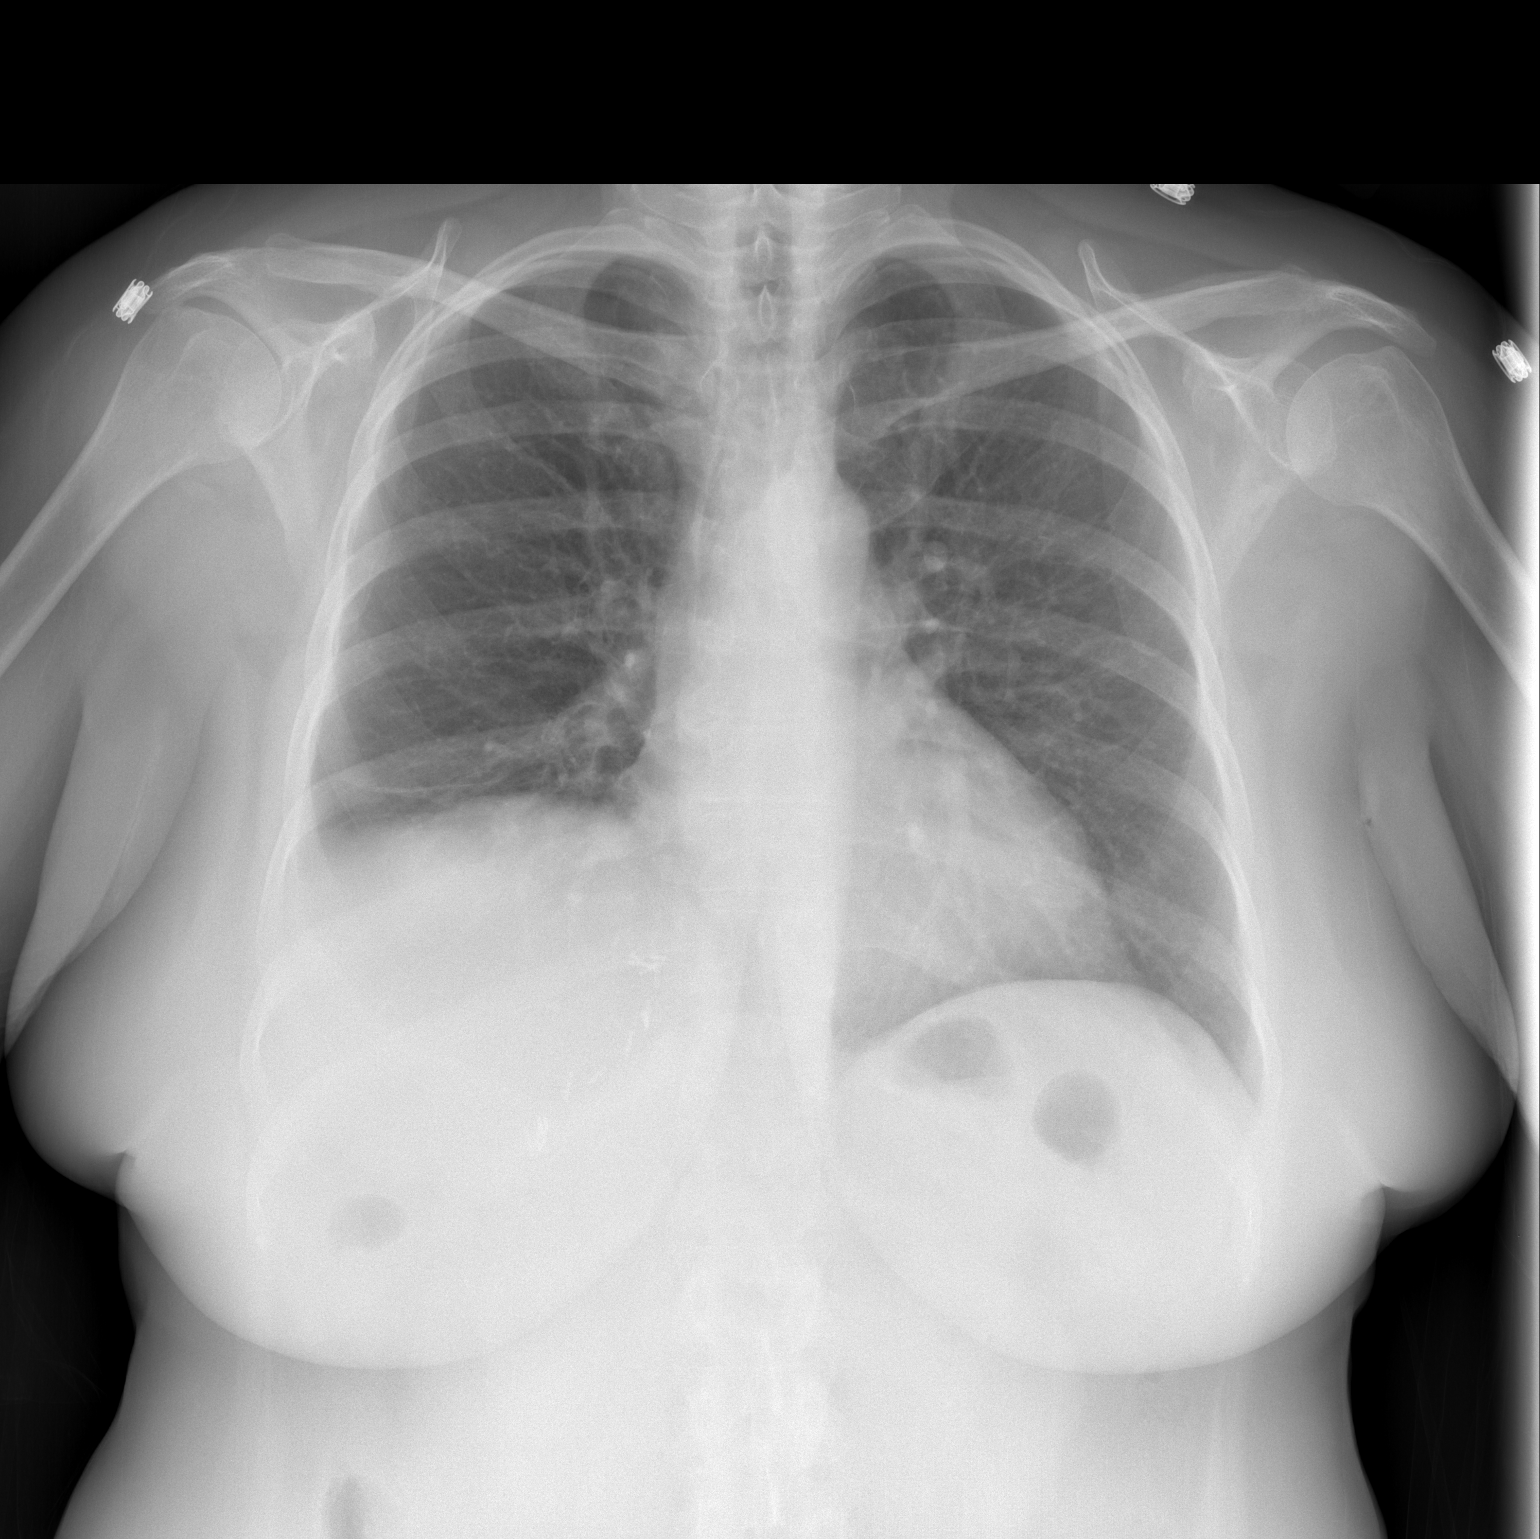

[1 of 1 positions shown; findings below may reference images not displayed]

FINDINGS: There is no demonstrable pneumothorax. There is a fairly small
residual effusion on the right. Lungs elsewhere clear. Heart size
and pulmonary vascularity are normal. No adenopathy. There are
surgical clips in the medial right upper quadrant region.
IMPRESSION: No pneumothorax. Fairly small residual effusion on the right. No
edema or consolidation.

## 2015-11-28 DIAGNOSIS — Z1389 Encounter for screening for other disorder: Secondary | ICD-10-CM | POA: Diagnosis not present

## 2015-11-28 DIAGNOSIS — M25531 Pain in right wrist: Secondary | ICD-10-CM | POA: Diagnosis not present

## 2015-11-28 DIAGNOSIS — E559 Vitamin D deficiency, unspecified: Secondary | ICD-10-CM | POA: Diagnosis not present

## 2015-11-28 DIAGNOSIS — I10 Essential (primary) hypertension: Secondary | ICD-10-CM | POA: Diagnosis not present

## 2015-11-28 DIAGNOSIS — Z9181 History of falling: Secondary | ICD-10-CM | POA: Diagnosis not present

## 2016-03-09 ENCOUNTER — Other Ambulatory Visit: Payer: Self-pay | Admitting: General Surgery

## 2016-03-09 DIAGNOSIS — Q446 Cystic disease of liver: Secondary | ICD-10-CM

## 2016-03-17 ENCOUNTER — Ambulatory Visit
Admission: RE | Admit: 2016-03-17 | Discharge: 2016-03-17 | Disposition: A | Discharge status: Home / Self Care | Payer: Medicare Other | Source: Ambulatory Visit | Attending: General Surgery | Admitting: General Surgery

## 2016-03-17 DIAGNOSIS — K7689 Other specified diseases of liver: Secondary | ICD-10-CM | POA: Diagnosis not present

## 2016-03-17 DIAGNOSIS — Q446 Cystic disease of liver: Secondary | ICD-10-CM

## 2016-03-19 DIAGNOSIS — Z23 Encounter for immunization: Secondary | ICD-10-CM | POA: Diagnosis not present

## 2016-03-24 NOTE — Progress Notes (Signed)
Please let the patient know that there are no large cysts like before.  There is one that is 5 cm (2 inches), but that size is unlikely to cause significant symptoms.  I would like to repeat imaging with a CT next time in around 6 months.  I am happy to see her in clinic to review what is going on if she would like.

## 2016-03-30 ENCOUNTER — Other Ambulatory Visit: Payer: Self-pay | Admitting: Family Medicine

## 2016-03-30 DIAGNOSIS — Z1231 Encounter for screening mammogram for malignant neoplasm of breast: Secondary | ICD-10-CM

## 2016-04-19 ENCOUNTER — Ambulatory Visit
Admission: RE | Admit: 2016-04-19 | Discharge: 2016-04-19 | Disposition: A | Discharge status: Home / Self Care | Payer: Medicare Other | Source: Ambulatory Visit | Attending: Family Medicine | Admitting: Family Medicine

## 2016-04-19 DIAGNOSIS — Z1231 Encounter for screening mammogram for malignant neoplasm of breast: Secondary | ICD-10-CM

## 2016-05-31 DIAGNOSIS — I10 Essential (primary) hypertension: Secondary | ICD-10-CM | POA: Diagnosis not present

## 2016-05-31 DIAGNOSIS — M8580 Other specified disorders of bone density and structure, unspecified site: Secondary | ICD-10-CM | POA: Diagnosis not present

## 2016-05-31 DIAGNOSIS — G43909 Migraine, unspecified, not intractable, without status migrainosus: Secondary | ICD-10-CM | POA: Diagnosis not present

## 2016-05-31 DIAGNOSIS — E7800 Pure hypercholesterolemia, unspecified: Secondary | ICD-10-CM | POA: Diagnosis not present

## 2016-07-06 DIAGNOSIS — R0981 Nasal congestion: Secondary | ICD-10-CM | POA: Diagnosis not present

## 2016-07-06 DIAGNOSIS — H6982 Other specified disorders of Eustachian tube, left ear: Secondary | ICD-10-CM | POA: Diagnosis not present

## 2016-12-16 DIAGNOSIS — L255 Unspecified contact dermatitis due to plants, except food: Secondary | ICD-10-CM | POA: Diagnosis not present

## 2017-01-05 DIAGNOSIS — I10 Essential (primary) hypertension: Secondary | ICD-10-CM | POA: Diagnosis not present

## 2017-01-05 DIAGNOSIS — E559 Vitamin D deficiency, unspecified: Secondary | ICD-10-CM | POA: Diagnosis not present

## 2017-01-05 DIAGNOSIS — E7800 Pure hypercholesterolemia, unspecified: Secondary | ICD-10-CM | POA: Diagnosis not present

## 2017-01-07 DIAGNOSIS — Z9181 History of falling: Secondary | ICD-10-CM | POA: Diagnosis not present

## 2017-01-07 DIAGNOSIS — I10 Essential (primary) hypertension: Secondary | ICD-10-CM | POA: Diagnosis not present

## 2017-01-07 DIAGNOSIS — Z1389 Encounter for screening for other disorder: Secondary | ICD-10-CM | POA: Diagnosis not present

## 2017-01-07 DIAGNOSIS — E559 Vitamin D deficiency, unspecified: Secondary | ICD-10-CM | POA: Diagnosis not present

## 2017-01-07 DIAGNOSIS — G43909 Migraine, unspecified, not intractable, without status migrainosus: Secondary | ICD-10-CM | POA: Diagnosis not present

## 2017-01-07 DIAGNOSIS — E7800 Pure hypercholesterolemia, unspecified: Secondary | ICD-10-CM | POA: Diagnosis not present

## 2017-01-07 DIAGNOSIS — Z139 Encounter for screening, unspecified: Secondary | ICD-10-CM | POA: Diagnosis not present

## 2017-01-17 DIAGNOSIS — H35363 Drusen (degenerative) of macula, bilateral: Secondary | ICD-10-CM | POA: Diagnosis not present

## 2017-01-27 ENCOUNTER — Other Ambulatory Visit: Payer: Self-pay

## 2017-01-27 NOTE — Patient Outreach (Signed)
[facility]  01/27/2017  Margaret BrunnerGloria B Wilkinson 09/11/1945 [id number]   Medication Adherence call to Mrs. Margaret DoorGloria Wilkinson the reason for this call is because Mrs. Margaret MuffBrewer is showing past due under [facility] [employer]on her lisinopril Margaret Wilkinson/hctz 20/12.5 Margaret Wilkinson said she has about a month left on her medication that [facility] has been filling the prescription ahead of time and that's why she still has medication she said she will need it in about a month from now.    Lillia AbedAna Ollison-Moran CPhT Pharmacy Technician [facility] Care Management Direct Dial [phone]  Fax [phone] [email or web address]

## 2017-01-31 DIAGNOSIS — H35363 Drusen (degenerative) of macula, bilateral: Secondary | ICD-10-CM | POA: Diagnosis not present

## 2017-03-10 DIAGNOSIS — E785 Hyperlipidemia, unspecified: Secondary | ICD-10-CM | POA: Diagnosis not present

## 2017-03-10 DIAGNOSIS — Z9181 History of falling: Secondary | ICD-10-CM | POA: Diagnosis not present

## 2017-03-10 DIAGNOSIS — Z136 Encounter for screening for cardiovascular disorders: Secondary | ICD-10-CM | POA: Diagnosis not present

## 2017-03-10 DIAGNOSIS — Z1211 Encounter for screening for malignant neoplasm of colon: Secondary | ICD-10-CM | POA: Diagnosis not present

## 2017-03-10 DIAGNOSIS — Z0000 Encounter for general adult medical examination without abnormal findings: Secondary | ICD-10-CM | POA: Diagnosis not present

## 2017-03-10 DIAGNOSIS — Z6823 Body mass index (BMI) 23.0-23.9, adult: Secondary | ICD-10-CM | POA: Diagnosis not present

## 2017-03-10 DIAGNOSIS — Z23 Encounter for immunization: Secondary | ICD-10-CM | POA: Diagnosis not present

## 2017-03-10 DIAGNOSIS — Z1389 Encounter for screening for other disorder: Secondary | ICD-10-CM | POA: Diagnosis not present

## 2017-03-25 ENCOUNTER — Other Ambulatory Visit: Payer: Self-pay | Admitting: General Surgery

## 2017-03-25 DIAGNOSIS — Q446 Cystic disease of liver: Secondary | ICD-10-CM

## 2017-03-30 DIAGNOSIS — Z1211 Encounter for screening for malignant neoplasm of colon: Secondary | ICD-10-CM | POA: Diagnosis not present

## 2017-03-30 DIAGNOSIS — Z1212 Encounter for screening for malignant neoplasm of rectum: Secondary | ICD-10-CM | POA: Diagnosis not present

## 2017-04-19 DIAGNOSIS — K219 Gastro-esophageal reflux disease without esophagitis: Secondary | ICD-10-CM | POA: Diagnosis not present

## 2017-04-19 DIAGNOSIS — I10 Essential (primary) hypertension: Secondary | ICD-10-CM | POA: Diagnosis not present

## 2017-04-19 DIAGNOSIS — R05 Cough: Secondary | ICD-10-CM | POA: Diagnosis not present

## 2017-04-25 ENCOUNTER — Other Ambulatory Visit: Payer: Self-pay | Admitting: Physician Assistant

## 2017-04-25 DIAGNOSIS — Z1231 Encounter for screening mammogram for malignant neoplasm of breast: Secondary | ICD-10-CM

## 2017-05-09 DIAGNOSIS — I10 Essential (primary) hypertension: Secondary | ICD-10-CM | POA: Diagnosis not present

## 2017-05-09 DIAGNOSIS — R05 Cough: Secondary | ICD-10-CM | POA: Diagnosis not present

## 2017-05-25 DIAGNOSIS — I10 Essential (primary) hypertension: Secondary | ICD-10-CM | POA: Diagnosis not present

## 2017-05-26 ENCOUNTER — Ambulatory Visit: Payer: Medicare Other

## 2017-06-16 ENCOUNTER — Ambulatory Visit
Admission: RE | Admit: 2017-06-16 | Discharge: 2017-06-16 | Disposition: A | Discharge status: Home / Self Care | Payer: Medicare Other | Source: Ambulatory Visit | Attending: Physician Assistant | Admitting: Physician Assistant

## 2017-06-16 DIAGNOSIS — Z1231 Encounter for screening mammogram for malignant neoplasm of breast: Secondary | ICD-10-CM

## 2017-07-28 DIAGNOSIS — I10 Essential (primary) hypertension: Secondary | ICD-10-CM | POA: Diagnosis not present

## 2017-07-28 DIAGNOSIS — E7800 Pure hypercholesterolemia, unspecified: Secondary | ICD-10-CM | POA: Diagnosis not present

## 2017-08-01 DIAGNOSIS — G43909 Migraine, unspecified, not intractable, without status migrainosus: Secondary | ICD-10-CM | POA: Diagnosis not present

## 2017-08-01 DIAGNOSIS — H9313 Tinnitus, bilateral: Secondary | ICD-10-CM | POA: Diagnosis not present

## 2017-08-01 DIAGNOSIS — E7800 Pure hypercholesterolemia, unspecified: Secondary | ICD-10-CM | POA: Diagnosis not present

## 2017-08-01 DIAGNOSIS — I10 Essential (primary) hypertension: Secondary | ICD-10-CM | POA: Diagnosis not present

## 2017-08-08 DIAGNOSIS — R6889 Other general symptoms and signs: Secondary | ICD-10-CM | POA: Diagnosis not present

## 2017-08-10 DIAGNOSIS — H1033 Unspecified acute conjunctivitis, bilateral: Secondary | ICD-10-CM | POA: Diagnosis not present

## 2017-08-10 DIAGNOSIS — J181 Lobar pneumonia, unspecified organism: Secondary | ICD-10-CM | POA: Diagnosis not present

## 2017-08-10 DIAGNOSIS — R509 Fever, unspecified: Secondary | ICD-10-CM | POA: Diagnosis not present

## 2017-08-10 DIAGNOSIS — I10 Essential (primary) hypertension: Secondary | ICD-10-CM | POA: Diagnosis not present

## 2017-08-10 DIAGNOSIS — R05 Cough: Secondary | ICD-10-CM | POA: Diagnosis not present

## 2017-08-19 DIAGNOSIS — Z79899 Other long term (current) drug therapy: Secondary | ICD-10-CM | POA: Diagnosis not present

## 2017-08-19 DIAGNOSIS — J189 Pneumonia, unspecified organism: Secondary | ICD-10-CM | POA: Diagnosis not present

## 2017-08-19 DIAGNOSIS — Z23 Encounter for immunization: Secondary | ICD-10-CM | POA: Diagnosis not present

## 2017-09-05 DIAGNOSIS — J189 Pneumonia, unspecified organism: Secondary | ICD-10-CM | POA: Diagnosis not present

## 2017-09-05 DIAGNOSIS — J9811 Atelectasis: Secondary | ICD-10-CM | POA: Diagnosis not present

## 2017-11-21 ENCOUNTER — Other Ambulatory Visit: Payer: Self-pay | Admitting: General Surgery

## 2017-11-21 DIAGNOSIS — Q446 Cystic disease of liver: Secondary | ICD-10-CM

## 2017-12-06 ENCOUNTER — Ambulatory Visit
Admission: RE | Admit: 2017-12-06 | Discharge: 2017-12-06 | Disposition: A | Discharge status: Home / Self Care | Payer: Medicare Other | Source: Ambulatory Visit | Attending: General Surgery | Admitting: General Surgery

## 2017-12-06 DIAGNOSIS — Q446 Cystic disease of liver: Secondary | ICD-10-CM | POA: Diagnosis not present

## 2017-12-06 MED ORDER — IOPAMIDOL (ISOVUE-300) INJECTION 61%
100.0000 mL | Freq: Once | INTRAVENOUS | Status: AC | PRN
  Administered 2017-12-06: 100 mL via INTRAVENOUS

## 2017-12-12 NOTE — Progress Notes (Signed)
CT shows some very small cystic areas, but nothing large or concerning.  Please let patient know.

## 2017-12-26 DIAGNOSIS — Q446 Cystic disease of liver: Secondary | ICD-10-CM | POA: Diagnosis not present

## 2018-01-16 DIAGNOSIS — H52223 Regular astigmatism, bilateral: Secondary | ICD-10-CM | POA: Diagnosis not present

## 2018-01-16 DIAGNOSIS — H2513 Age-related nuclear cataract, bilateral: Secondary | ICD-10-CM | POA: Diagnosis not present

## 2018-01-30 DIAGNOSIS — E559 Vitamin D deficiency, unspecified: Secondary | ICD-10-CM | POA: Diagnosis not present

## 2018-01-30 DIAGNOSIS — I10 Essential (primary) hypertension: Secondary | ICD-10-CM | POA: Diagnosis not present

## 2018-01-30 DIAGNOSIS — E7800 Pure hypercholesterolemia, unspecified: Secondary | ICD-10-CM | POA: Diagnosis not present

## 2018-02-01 DIAGNOSIS — Z23 Encounter for immunization: Secondary | ICD-10-CM | POA: Diagnosis not present

## 2018-02-01 DIAGNOSIS — I10 Essential (primary) hypertension: Secondary | ICD-10-CM | POA: Diagnosis not present

## 2018-02-01 DIAGNOSIS — G43909 Migraine, unspecified, not intractable, without status migrainosus: Secondary | ICD-10-CM | POA: Diagnosis not present

## 2018-02-01 DIAGNOSIS — E7800 Pure hypercholesterolemia, unspecified: Secondary | ICD-10-CM | POA: Diagnosis not present

## 2018-02-01 DIAGNOSIS — M8580 Other specified disorders of bone density and structure, unspecified site: Secondary | ICD-10-CM | POA: Diagnosis not present

## 2018-07-25 ENCOUNTER — Other Ambulatory Visit: Payer: Self-pay | Admitting: Physician Assistant

## 2018-07-25 DIAGNOSIS — E2839 Other primary ovarian failure: Secondary | ICD-10-CM

## 2018-07-31 DIAGNOSIS — I10 Essential (primary) hypertension: Secondary | ICD-10-CM | POA: Diagnosis not present

## 2018-07-31 DIAGNOSIS — E7800 Pure hypercholesterolemia, unspecified: Secondary | ICD-10-CM | POA: Diagnosis not present

## 2018-07-31 DIAGNOSIS — E559 Vitamin D deficiency, unspecified: Secondary | ICD-10-CM | POA: Diagnosis not present

## 2018-08-02 ENCOUNTER — Other Ambulatory Visit: Payer: Self-pay | Admitting: Physician Assistant

## 2018-08-02 DIAGNOSIS — Z139 Encounter for screening, unspecified: Secondary | ICD-10-CM | POA: Diagnosis not present

## 2018-08-02 DIAGNOSIS — Z1231 Encounter for screening mammogram for malignant neoplasm of breast: Secondary | ICD-10-CM

## 2018-08-02 DIAGNOSIS — G43909 Migraine, unspecified, not intractable, without status migrainosus: Secondary | ICD-10-CM | POA: Diagnosis not present

## 2018-08-02 DIAGNOSIS — Z9181 History of falling: Secondary | ICD-10-CM | POA: Diagnosis not present

## 2018-08-02 DIAGNOSIS — E7800 Pure hypercholesterolemia, unspecified: Secondary | ICD-10-CM | POA: Diagnosis not present

## 2018-08-02 DIAGNOSIS — R7303 Prediabetes: Secondary | ICD-10-CM | POA: Diagnosis not present

## 2018-08-02 DIAGNOSIS — I10 Essential (primary) hypertension: Secondary | ICD-10-CM | POA: Diagnosis not present

## 2018-10-04 ENCOUNTER — Other Ambulatory Visit: Payer: Medicare Other

## 2018-10-04 ENCOUNTER — Ambulatory Visit: Payer: Medicare Other

## 2018-10-23 DIAGNOSIS — R079 Chest pain, unspecified: Secondary | ICD-10-CM | POA: Diagnosis not present

## 2018-10-23 DIAGNOSIS — E7800 Pure hypercholesterolemia, unspecified: Secondary | ICD-10-CM | POA: Diagnosis not present

## 2018-10-23 DIAGNOSIS — M542 Cervicalgia: Secondary | ICD-10-CM | POA: Diagnosis not present

## 2018-10-23 DIAGNOSIS — I10 Essential (primary) hypertension: Secondary | ICD-10-CM | POA: Diagnosis not present

## 2018-10-23 DIAGNOSIS — K219 Gastro-esophageal reflux disease without esophagitis: Secondary | ICD-10-CM | POA: Diagnosis not present

## 2018-10-24 ENCOUNTER — Telehealth: Payer: Self-pay | Admitting: Cardiology

## 2018-10-24 ENCOUNTER — Telehealth (INDEPENDENT_AMBULATORY_CARE_PROVIDER_SITE_OTHER): Payer: Medicare Other | Admitting: Cardiology

## 2018-10-24 ENCOUNTER — Other Ambulatory Visit: Payer: Self-pay | Admitting: *Deleted

## 2018-10-24 ENCOUNTER — Other Ambulatory Visit: Payer: Self-pay

## 2018-10-24 ENCOUNTER — Encounter: Payer: Self-pay | Admitting: *Deleted

## 2018-10-24 VITALS — BP 109/51 | HR 64 | Ht 62.0 in | Wt 167.0 lb

## 2018-10-24 DIAGNOSIS — R0789 Other chest pain: Secondary | ICD-10-CM | POA: Diagnosis not present

## 2018-10-24 DIAGNOSIS — E782 Mixed hyperlipidemia: Secondary | ICD-10-CM | POA: Insufficient documentation

## 2018-10-24 DIAGNOSIS — I10 Essential (primary) hypertension: Secondary | ICD-10-CM | POA: Insufficient documentation

## 2018-10-24 DIAGNOSIS — R079 Chest pain, unspecified: Secondary | ICD-10-CM

## 2018-10-24 MED ORDER — NITROGLYCERIN 0.4 MG SL SUBL: 0.4000 mg | SUBLINGUAL_TABLET | SUBLINGUAL | 3 refills | Status: AC | PRN

## 2018-10-24 NOTE — Addendum Note (Signed)
Addended by: Pamala Hurry on: 10/24/2018 03:55 PM   Modules accepted: Orders

## 2018-10-24 NOTE — Progress Notes (Signed)
Virtual Visit via Telephone Note   This visit type was conducted due to national recommendations for restrictions regarding the COVID-19 Pandemic (e.g. social distancing) in an effort to limit this patient's exposure and mitigate transmission in our community.  Due to her co-morbid illnesses, this patient is at least at moderate risk for complications without adequate follow up.  This format is felt to be most appropriate for this patient at this time.  The patient did not have access to video technology/had technical difficulties with video requiring transitioning to audio format only (telephone).  All issues noted in this document were discussed and addressed.  No physical exam could be performed with this format.  Please refer to the patient's chart for her  consent to telehealth for [facility].   Date:  10/24/2018   ID:  Margaret Wilkinson, DOB 12-02-1945, MRN [id number]  Patient Location: Home Provider Location: Home  PCP:  Ailene Ravel, MD  Cardiologist:  No primary care provider on file.  Electrophysiologist:  None   Evaluation Performed:  Consultation - Margaret Wilkinson was referred by Lonie Peak for the evaluation of chest discomfort.  Chief Complaint:  Chest discomfort   History of Present Illness:    Margaret Wilkinson is a 73 y.o. female with past medical history of essential hypertension and is here.  Patient mentions to me that she woke up yesterday with discomfort in her chest and both shoulders.  And she saw her primary gave her some medications for inflammation and she feels better.  No orthopnea or PND no substernal chest tightness.  She tells me that she is an active lady and not exercise on a regular since.  Activity especially when she plays around with her grandchildren does not bring around the symptoms.  At the time of my evaluation, the patient is alert awake oriented and in no distress.  The patient does not have symptoms concerning for COVID-19 infection (fever,  chills, cough, or new shortness of breath).    Past Medical History:  Diagnosis Date  . Arthritis    "knees" (12/26/2014)  . Hypercholesteremia   . Hypertension   . Knee pain   . Liver cyst   . Migraine    "q 3 months or so" (12/26/2014)  . Osteopenia   . Prediabetes   . Sciatica of left side   . Tinnitus of both ears   . Vitamin D deficiency    Past Surgical History:  Procedure Laterality Date  . ABDOMINAL HYSTERECTOMY  ~ 2004  . LAPAROSCOPIC CHOLECYSTECTOMY  ~ 2004  . LAPAROSCOPIC HEPATECTOMY N/A 12/26/2014   Procedure: LAPAROSCOPIC LIVER CYST UNROOFING;  Surgeon: Almond Lint, MD;  Location: [facility] OR;  Service: General;  Laterality: N/A;  . LAPAROSCOPIC LIVER CYST UNROOFING  12/26/2014  . TONSILLECTOMY  ~ 1951     Current Meds  Medication Sig  . atenolol (TENORMIN) 50 MG tablet Take 50 mg by mouth daily.  . Calcium Carb-Cholecalciferol (CALCIUM 600 + D PO) Take 1 tablet by mouth daily.  . cholecalciferol (VITAMIN D) 1000 UNITS tablet Take 1,000 Units by mouth daily.  Marland Kitchen lisinopril-hydrochlorothiazide (PRINZIDE,ZESTORETIC) 20-12.5 MG per tablet Take 1 tablet by mouth daily.  . meloxicam (MOBIC) 15 MG tablet Take 15 mg by mouth daily.   . Multiple Vitamins-Minerals (MULTIVITAMIN WITH MINERALS) tablet Take 1 tablet by mouth daily.  . rizatriptan (MAXALT) 10 MG tablet Take 10 mg by mouth as needed for migraine. May repeat in 2 hours if needed  Allergies:   Patient has no known allergies.   Social History   Tobacco Use  . Smoking status: Never Smoker  . Smokeless tobacco: Never Used  Substance Use Topics  . Alcohol use: No  . Drug use: No     Family Hx: The patient's family history is not on file.  ROS:   Please see the history of present illness.    As mentioned above All other systems reviewed and are negative.   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  The quality of the faxes not optimal.  Recent Labs: No results found for requested labs within last 8760 hours.   Recent Lipid Panel No results found for: CHOL, TRIG, HDL, CHOLHDL, LDLCALC, LDLDIRECT  Wt Readings from Last 3 Encounters:  10/24/18 167 lb (75.8 kg)  12/26/14 173 lb (78.5 kg)  12/25/14 173 lb 6.4 oz (78.7 kg)     Objective:    Vital Signs:  BP (!) 109/51 (BP Location: Left Arm, Patient Position: Sitting, Cuff Size: Normal)   Pulse 64   Ht 5\' 2"  (1.575 m)   Wt 167 lb (75.8 kg)   BMI 30.54 kg/m    VITAL SIGNS:  reviewed  ASSESSMENT & PLAN:    1. Chest discomfort: Patient symptoms appear atypical for coronary etiology.  However she has multiple risk factors for coronary artery disease and leads a fairly sedentary lifestyle.  For this reason the following recommendations were made to the patient.  Sublingual nitroglycerin prescription was sent, its protocol and 911 protocol explained and the patient vocalized understanding questions were answered to the patient's satisfaction.  Evaluate this patient will undergo Lexiscan in the interim if she has any significant symptoms not responding to nitroglycerin she knows to go to the nearest emergency room. 2. Essential hypertension: Her blood pressure is borderline but she is asymptomatic and feeling fine from that standpoint. 3. Mixed dyslipidemia: I discussed this with the patient based on the physician notes.  However the patient mentions to me that her cholesterol is fine and she is not worried about it.  I will let her primary care follow these issues. 4. Patient will be seen in follow-up appointment in 3 months or earlier if the patient has any concerns   COVID-19 Education: The signs and symptoms of COVID-19 were discussed with the patient and how to seek care for testing (follow up with PCP or arrange  E-visit).  The importance of social distancing was discussed today.  Time:   Today, I have spent 30 minutes with the patient with telehealth technology discussing the above problems.     Medication Adjustments/Labs and Tests Ordered: Current medicines are reviewed at length with the patient today.  Concerns regarding medicines are outlined above.   Tests Ordered: No orders of the defined types were placed in this encounter.   Medication Changes: No orders of the defined types were placed in this encounter.   Disposition:  Follow up in 3 month(s)  Signed, Garwin Brothersajan R Revankar, MD  10/24/2018 2:15 PM    [facility]

## 2018-10-24 NOTE — Telephone Encounter (Signed)
Virtual Visit Pre-Appointment Phone Call  "(Name), I am calling you today to discuss your upcoming appointment. We are currently trying to limit exposure to the virus that causes COVID-19 by seeing patients at home rather than in the office."  1. "What is the BEST phone number to call the day of the visit?" - include this in appointment notes  2. Do you have or have access to (through a family member/friend) a smartphone with video capability that we can use for your visit?" a. If yes - list this number in appt notes as cell (if different from BEST phone #) and list the appointment type as a VIDEO visit in appointment notes b. If no - list the appointment type as a PHONE visit in appointment notes  3. Confirm consent - "In the setting of the current Covid19 crisis, you are scheduled for a (phone or video) visit with your provider on (date) at (time).  Just as we do with many in-office visits, in order for you to participate in this visit, we must obtain consent.  If you'd like, I can send this to your mychart (if signed up) or email for you to review.  Otherwise, I can obtain your verbal consent now.  All virtual visits are billed to your insurance company just like a normal visit would be.  By agreeing to a virtual visit, we'd like you to understand that the technology does not allow for your provider to perform an examination, and thus may limit your provider's ability to fully assess your condition. If your provider identifies any concerns that need to be evaluated in person, we will make arrangements to do so.  Finally, though the technology is pretty good, we cannot assure that it will always work on either your or our end, and in the setting of a video visit, we may have to convert it to a phone-only visit.  In either situation, we cannot ensure that we have a secure connection.  Are you willing to proceed?" STAFF: Did the patient verbally acknowledge consent to telehealth visit? Document  YES/NO here: YES  4. Advise patient to be prepared - "Two hours prior to your appointment, go ahead and check your blood pressure, pulse, oxygen saturation, and your weight (if you have the equipment to check those) and write them all down. When your visit starts, your provider will ask you for this information. If you have an Apple Watch or Kardia device, please plan to have heart rate information ready on the day of your appointment. Please have a pen and paper handy nearby the day of the visit as well."  5. Give patient instructions for MyChart download to smartphone OR Doximity/Doxy.me as below if video visit (depending on what platform provider is using)  6. Inform patient they will receive a phone call 15 minutes prior to their appointment time (may be from unknown caller ID) so they should be prepared to answer    TELEPHONE CALL NOTE  Margaret Wilkinson has been deemed a candidate for a follow-up tele-health visit to limit community exposure during the Covid-19 pandemic. I spoke with the patient via phone to ensure availability of phone/video source, confirm preferred email & phone number, and discuss instructions and expectations.  I reminded Margaret Wilkinson to be prepared with any vital sign and/or heart rhythm information that could potentially be obtained via home monitoring, at the time of her visit. I reminded Margaret Wilkinson to expect a phone call prior to  her visit.  Minette BrineLisa B Welch 10/24/2018 11:38 AM   INSTRUCTIONS FOR DOWNLOADING THE MYCHART APP TO SMARTPHONE  - The patient must first make sure to have activated MyChart and know their login information - If Apple, go to [employer] Store and type in MyChart in the search bar and download the app. If Android, ask patient to go to [employer] Play Store and type in [facility] in the search bar and download the app. The app is free but as with any other app downloads, their phone may require them to verify saved payment information or Apple/Android  password.  - The patient will need to then log into the app with their MyChart username and password, and select [facility] as their healthcare provider to link the account. When it is time for your visit, go to the MyChart app, find appointments, and click Begin Video Visit. Be sure to Select Allow for your device to access the Microphone and Camera for your visit. You will then be connected, and your provider will be with you shortly.  **If they have any issues connecting, or need assistance please contact MyChart service desk [phone])**  **If using a computer, in order to ensure the best quality for their visit they will need to use either of the following Internet Browsers: [employer] Edge, or Google Chrome**  IF USING DOXIMITY or DOXY.ME - The patient will receive a link just prior to their visit by text.     FULL LENGTH CONSENT FOR TELE-HEALTH VISIT   I hereby voluntarily request, consent and authorize [facility] and its employed or contracted physicians, physician assistants, nurse practitioners or other licensed health care professionals (the Practitioner), to provide me with telemedicine health care services (the Services") as deemed necessary by the treating Practitioner. I acknowledge and consent to receive the Services by the Practitioner via telemedicine. I understand that the telemedicine visit will involve communicating with the Practitioner through live audiovisual communication technology and the disclosure of certain medical information by electronic transmission. I acknowledge that I have been given the opportunity to request an in-person assessment or other available alternative prior to the telemedicine visit and am voluntarily participating in the telemedicine visit.  I understand that I have the right to withhold or withdraw my consent to the use of telemedicine in the course of my care at any time, without affecting my right to future care or treatment,  and that the Practitioner or I may terminate the telemedicine visit at any time. I understand that I have the right to inspect all information obtained and/or recorded in the course of the telemedicine visit and may receive copies of available information for a reasonable fee.  I understand that some of the potential risks of receiving the Services via telemedicine include:   Delay or interruption in medical evaluation due to technological equipment failure or disruption;  Information transmitted may not be sufficient (e.g. poor resolution of images) to allow for appropriate medical decision making by the Practitioner; and/or   In rare instances, security protocols could fail, causing a breach of personal health information.  Furthermore, I acknowledge that it is my responsibility to provide information about my medical history, conditions and care that is complete and accurate to the best of my ability. I acknowledge that Practitioner's advice, recommendations, and/or decision may be based on factors not within their control, such as incomplete or inaccurate data provided by me or distortions of diagnostic images or specimens that may result from electronic transmissions. I  understand that the practice of medicine is not an exact science and that Practitioner makes no warranties or guarantees regarding treatment outcomes. I acknowledge that I will receive a copy of this consent concurrently upon execution via email to the email address I last provided but may also request a printed copy by calling the office of [place].    I understand that my insurance will be billed for this visit.   I have read or had this consent read to me.  I understand the contents of this consent, which adequately explains the benefits and risks of the Services being provided via telemedicine.   I have been provided ample opportunity to ask questions regarding this consent and the Services and have had my questions  answered to my satisfaction.  I give my informed consent for the services to be provided through the use of telemedicine in my medical care  By participating in this telemedicine visit I agree to the above.

## 2018-10-24 NOTE — Patient Instructions (Addendum)
Medication Instructions:   Your physician has recommended you make the following change in your medication:  START taking nitroglycerin as needed for chest pain, When having chest pain, stop what you are doing and sit down. Take 1 nitro, wait 5 minutes. Still having chest pain, take 1 nitro, wait 5 minutes. Still having chest pain, take 1 nitro, dial 911. Total of 3 nitro in 15 minutes.   If you need a refill on your cardiac medications before your next appointment, please call your pharmacy.   Lab work: NONE If you have labs (blood work) drawn today and your tests are completely normal, you will receive your results only by: Marland Kitchen MyChart Message (if you have MyChart) OR . A paper copy in the mail If you have any lab test that is abnormal or we need to change your treatment, we will call you to review the results.  Testing/Procedures: Your physician has requested that you have a lexiscan myoview. For further information please visit [email or web address] Please follow instruction sheet, as given.   [facility] [address] [place], [place] [address] Phone:  [phone]  October 24, 2018    Margaret Wilkinson DOB: [birth year] MRN: [id number] [address] [place] [address]   Dear Margaret Wilkinson,  You are scheduled for a Myocardial Perfusion Imaging Study on:November 14, 2018 at 11:15 am.  Please arrive 15 minutes prior to your appointment time for registration and insurance purposes.  The test will take approximately 3 to 4 hours to complete; you may bring reading material.  If someone comes with you to your appointment, they will need to remain in the main lobby due to limited space in the testing area. **If you are pregnant or breastfeeding, please notify the nuclear lab prior to your appointment**  How to prepare for your Myocardial Perfusion Test: . Do not eat or drink 3 hours prior to your test, except you may have water. . Do not consume products containing caffeine  (regular or decaffeinated) 12 hours prior to your test. (ex: coffee, chocolate, sodas, tea). Do bring a list of your current medications with you.  If not listed below, you may take your medications as normal. . Do not take atenolol (Tenormin) for 24 hours prior to the test.  Bring the medication to your appointment as you may be required to take it once the test is complete. . Do wear comfortable clothes (no dresses or overalls) and walking shoes, tennis shoes preferred (No heels or open toe shoes are allowed). . Do NOT wear cologne, perfume, aftershave, or lotions (deodorant is allowed). . If these instructions are not followed, your test will have to be rescheduled.  Please report to [address] for your test.  If you have questions or concerns about your appointment, you can call the [facility] at [phone].  If you cannot keep your appointment, please provide 24 hours notification to the Nuclear Lab, to avoid a possible $50 charge to your account.   Follow-Up: At [facility], you and your health needs are our priority.  As part of our continuing mission to provide you with exceptional heart care, we have created designated Provider Care Teams.  These Care Teams include your primary Cardiologist (physician) and Advanced Practice Providers (APPs -  Physician Assistants and Nurse Practitioners) who all work together to provide you with the care you need, when you need it. You will need a follow up appointment in 3 months.  Any Other Special Instructions Will Be Listed Below  Nitroglycerin sublingual tablets What is this medicine? NITROGLYCERIN (nye troe GLI ser in) is a type of vasodilator. It relaxes blood vessels, increasing the blood and oxygen supply to your heart. This medicine is used to relieve chest pain caused by angina. It is also used to prevent chest pain before activities like climbing stairs, going outdoors in cold weather, or  sexual activity. This medicine may be used for other purposes; ask your health care provider or pharmacist if you have questions. COMMON BRAND NAME(S): Nitroquick, Nitrostat, Nitrotab What should I tell my health care provider before I take this medicine? They need to know if you have any of these conditions: -anemia -head injury, recent stroke, or bleeding in the brain -liver disease -previous heart attack -an unusual or allergic reaction to nitroglycerin, other medicines, foods, dyes, or preservatives -pregnant or trying to get pregnant -breast-feeding How should I use this medicine? Take this medicine by mouth as needed. At the first sign of an angina attack (chest pain or tightness) place one tablet under your tongue. You can also take this medicine 5 to 10 minutes before an event likely to produce chest pain. Follow the directions on the prescription label. Let the tablet dissolve under the tongue. Do not swallow whole. Replace the dose if you accidentally swallow it. It will help if your mouth is not dry. Saliva around the tablet will help it to dissolve more quickly. Do not eat or drink, smoke or chew tobacco while a tablet is dissolving. If you are not better within 5 minutes after taking ONE dose of nitroglycerin, call 9-1-1 immediately to seek emergency medical care. Do not take more than 3 nitroglycerin tablets over 15 minutes. If you take this medicine often to relieve symptoms of angina, your doctor or health care professional may provide you with different instructions to manage your symptoms. If symptoms do not go away after following these instructions, it is important to call 9-1-1 immediately. Do not take more than 3 nitroglycerin tablets over 15 minutes. Talk to your pediatrician regarding the use of this medicine in children. Special care may be needed. Overdosage: If you think you have taken too much of this medicine contact a poison control center or emergency room at once.  NOTE: This medicine is only for you. Do not share this medicine with others. What if I miss a dose? This does not apply. This medicine is only used as needed. What may interact with this medicine? Do not take this medicine with any of the following medications: -certain migraine medicines like ergotamine and dihydroergotamine (DHE) -medicines used to treat erectile dysfunction like sildenafil, tadalafil, and vardenafil -riociguat This medicine may also interact with the following medications: -alteplase -aspirin -heparin -medicines for high blood pressure -medicines for mental depression -other medicines used to treat angina -phenothiazines like chlorpromazine, mesoridazine, prochlorperazine, thioridazine This list may not describe all possible interactions. Give your health care provider a list of all the medicines, herbs, non-prescription drugs, or dietary supplements you use. Also tell them if you smoke, drink alcohol, or use illegal drugs. Some items may interact with your medicine. What should I watch for while using this medicine? Tell your doctor or health care professional if you feel your medicine is no longer working. Keep this medicine with you at all times. Sit or lie down when you take your medicine to prevent falling if you feel dizzy or faint after using it. Try to remain calm. This  will help you to feel better faster. If you feel dizzy, take several deep breaths and lie down with your feet propped up, or bend forward with your head resting between your knees. You may get drowsy or dizzy. Do not drive, use machinery, or do anything that needs mental alertness until you know how this drug affects you. Do not stand or sit up quickly, especially if you are an older patient. This reduces the risk of dizzy or fainting spells. Alcohol can make you more drowsy and dizzy. Avoid alcoholic drinks. Do not treat yourself for coughs, colds, or pain while you are taking this medicine without  asking your doctor or health care professional for advice. Some ingredients may increase your blood pressure. What side effects may I notice from receiving this medicine? Side effects that you should report to your doctor or health care professional as soon as possible: -blurred vision -dry mouth -skin rash -sweating -the feeling of extreme pressure in the head -unusually weak or tired Side effects that usually do not require medical attention (report to your doctor or health care professional if they continue or are bothersome): -flushing of the face or neck -headache -irregular heartbeat, palpitations -nausea, vomiting This list may not describe all possible side effects. Call your doctor for medical advice about side effects. You may report side effects to FDA at 1-800-FDA-1088. Where should I keep my medicine? Keep out of the reach of children. Store at room temperature between 20 and 25 degrees C (68 and 77 degrees F). Store in Retail buyeroriginal container. Protect from light and moisture. Keep tightly closed. Throw away any unused medicine after the expiration date. NOTE: This sheet is a summary. It may not cover all possible information. If you have questions about this medicine, talk to your doctor, pharmacist, or health care provider.  2019 Elsevier/Gold Standard (2013-03-08 17:57:36)  Regadenoson injection What is this medicine? REGADENOSON is used to test the heart for coronary artery disease. It is used in patients who can not exercise for their stress test. This medicine may be used for other purposes; ask your health care provider or pharmacist if you have questions. COMMON BRAND NAME(S): Lexiscan What should I tell my health care provider before I take this medicine? They need to know if you have any of these conditions: -heart problems -lung or breathing disease, like asthma or COPD -an unusual or allergic reaction to regadenoson, other medicines, foods, dyes, or preservatives  -pregnant or trying to get pregnant -breast-feeding How should I use this medicine? This medicine is for injection into a vein. It is given by a health care professional in a hospital or clinic setting. Talk to your pediatrician regarding the use of this medicine in children. Special care may be needed. Overdosage: If you think you have taken too much of this medicine contact a poison control center or emergency room at once. NOTE: This medicine is only for you. Do not share this medicine with others. What if I miss a dose? This does not apply. What may interact with this medicine? -caffeine -dipyridamole -guarana -theophylline This list may not describe all possible interactions. Give your health care provider a list of all the medicines, herbs, non-prescription drugs, or dietary supplements you use. Also tell them if you smoke, drink alcohol, or use illegal drugs. Some items may interact with your medicine. What should I watch for while using this medicine? Your condition will be monitored carefully while you are receiving this medicine. Do not take medicines, foods,  or drinks with caffeine (like coffee, tea, or colas) for at least 12 hours before your test. If you do not know if something contains caffeine, ask your health care professional. What side effects may I notice from receiving this medicine? Side effects that you should report to your doctor or health care professional as soon as possible: -allergic reactions like skin rash, itching or hives, swelling of the face, lips, or tongue -breathing problems -chest pain, tightness or palpitations -severe headache Side effects that usually do not require medical attention (report to your doctor or health care professional if they continue or are bothersome): -flushing -headache -irritation or pain at site where injected -nausea, vomiting This list may not describe all possible side effects. Call your doctor for medical advice about  side effects. You may report side effects to FDA at 1-800-FDA-1088. Where should I keep my medicine? This drug is given in a hospital or clinic and will not be stored at home. NOTE: This sheet is a summary. It may not cover all possible information. If you have questions about this medicine, talk to your doctor, pharmacist, or health care provider.  2019 Elsevier/Gold Standard (2008-01-08 15:08:13)   Cardiac Nuclear Scan A cardiac nuclear scan is a test that is done to check the flow of blood to your heart. It is done when you are resting and when you are exercising. The test looks for problems such as:  Not enough blood reaching a portion of the heart.  The heart muscle not working as it should. You may need this test if:  You have heart disease.  You have had lab results that are not normal.  You have had heart surgery or a balloon procedure to open up blocked arteries (angioplasty).  You have chest pain.  You have shortness of breath. In this test, a special dye (tracer) is put into your bloodstream. The tracer will travel to your heart. A camera will then take pictures of your heart to see how the tracer moves through your heart. This test is usually done at a hospital and takes 2-4 hours. Tell a doctor about:  Any allergies you have.  All medicines you are taking, including vitamins, herbs, eye drops, creams, and over-the-counter medicines.  Any problems you or family members have had with anesthetic medicines.  Any blood disorders you have.  Any surgeries you have had.  Any medical conditions you have.  Whether you are pregnant or may be pregnant. What are the risks? Generally, this is a safe test. However, problems may occur, such as:  Serious chest pain and heart attack. This is only a risk if the stress portion of the test is done.  Rapid heartbeat.  A feeling of warmth in your chest. This feeling usually does not last long.  Allergic reaction to the tracer.  What happens before the test?  Ask your doctor about changing or stopping your normal medicines. This is important.  Follow instructions from your doctor about what you cannot eat or drink.  Remove your jewelry on the day of the test. What happens during the test?  An IV tube will be inserted into one of your veins.  Your doctor will give you a small amount of tracer through the IV tube.  You will wait for 20-40 minutes while the tracer moves through your bloodstream.  Your heart will be monitored with an electrocardiogram (ECG).  You will lie down on an exam table.  Pictures of your heart will be taken  for about 15-20 minutes.  You may also have a stress test. For this test, one of these things may be done: ? You will be asked to exercise on a treadmill or a stationary bike. ? You will be given medicines that will make your heart work harder. This is done if you are unable to exercise.  When blood flow to your heart has peaked, a tracer will again be given through the IV tube.  After 20-40 minutes, you will get back on the exam table. More pictures will be taken of your heart.  Depending on the tracer that is used, more pictures may need to be taken 3-4 hours later.  Your IV tube will be removed when the test is over. The test may vary among doctors and hospitals. What happens after the test?  Ask your doctor: ? Whether you can return to your normal schedule, including diet, activities, and medicines. ? Whether you should drink more fluids. This will help to remove the tracer from your body. Drink enough fluid to keep your pee (urine) pale yellow.  Ask your doctor, or the department that is doing the test: ? When will my results be ready? ? How will I get my results? Summary  A cardiac nuclear scan is a test that is done to check the flow of blood to your heart.  Tell your doctor whether you are pregnant or may be pregnant.  Before the test, ask your doctor about  changing or stopping your normal medicines. This is important.  Ask your doctor whether you can return to your normal activities. You may be asked to drink more fluids. This information is not intended to replace advice given to you by your health care provider. Make sure you discuss any questions you have with your health care provider. Document Released: 10/24/2017 Document Revised: 10/24/2017 Document Reviewed: 10/24/2017 Elsevier Interactive Patient Education  2019 [employer].

## 2018-11-15 ENCOUNTER — Other Ambulatory Visit: Payer: Medicare Other

## 2018-11-15 ENCOUNTER — Ambulatory Visit: Payer: Medicare Other

## 2018-11-23 ENCOUNTER — Telehealth (HOSPITAL_COMMUNITY): Payer: Self-pay | Admitting: *Deleted

## 2018-11-23 NOTE — Telephone Encounter (Signed)
Patient given detailed instructions per Myocardial Perfusion Study Information Sheet for the test on 11/28/18 Patient notified to arrive 15 minutes early and that it is imperative to arrive on time for appointment to keep from having the test rescheduled.  If you need to cancel or reschedule your appointment, please call the office within 24 hours of your appointment. . Patient verbalized understanding. Margaret Wilkinson

## 2018-11-28 ENCOUNTER — Other Ambulatory Visit: Payer: Self-pay

## 2018-11-28 ENCOUNTER — Ambulatory Visit (INDEPENDENT_AMBULATORY_CARE_PROVIDER_SITE_OTHER): Payer: Medicare Other

## 2018-11-28 DIAGNOSIS — R079 Chest pain, unspecified: Secondary | ICD-10-CM | POA: Diagnosis not present

## 2018-11-28 LAB — MYOCARDIAL PERFUSION IMAGING
Peak HR: 109 {beats}/min
Rest HR: 78 {beats}/min

## 2018-11-28 MED ORDER — TECHNETIUM TC 99M TETROFOSMIN IV KIT
31.2000 | PACK | Freq: Once | INTRAVENOUS | Status: AC | PRN
  Administered 2018-11-28: 31.2 via INTRAVENOUS

## 2018-11-28 MED ORDER — TECHNETIUM TC 99M TETROFOSMIN IV KIT
10.8000 | PACK | Freq: Once | INTRAVENOUS | Status: AC | PRN
  Administered 2018-11-28: 10.8 via INTRAVENOUS

## 2018-11-28 MED ORDER — REGADENOSON 0.4 MG/5ML IV SOLN
0.4000 mg | Freq: Once | INTRAVENOUS | Status: AC
  Administered 2018-11-28: 0.4 mg via INTRAVENOUS

## 2018-11-29 ENCOUNTER — Encounter: Payer: Self-pay | Admitting: *Deleted

## 2018-11-29 ENCOUNTER — Telehealth: Payer: Self-pay | Admitting: *Deleted

## 2018-11-29 NOTE — Telephone Encounter (Signed)
-----   Message from Jenean Lindau, MD sent at 11/28/2018  2:51 PM EDT ----- The results of the study is unremarkable. Please inform patient. I will discuss in detail at next appointment. Cc  primary care/referring physician Jenean Lindau, MD 11/28/2018 2:51 PM

## 2018-11-29 NOTE — Telephone Encounter (Signed)
Telephone call to patient. Informed of normal stress test results. No further questions

## 2018-12-13 DIAGNOSIS — M542 Cervicalgia: Secondary | ICD-10-CM | POA: Diagnosis not present

## 2018-12-13 DIAGNOSIS — I10 Essential (primary) hypertension: Secondary | ICD-10-CM | POA: Diagnosis not present

## 2018-12-13 DIAGNOSIS — R079 Chest pain, unspecified: Secondary | ICD-10-CM | POA: Diagnosis not present

## 2018-12-13 DIAGNOSIS — R5383 Other fatigue: Secondary | ICD-10-CM | POA: Diagnosis not present

## 2018-12-20 DIAGNOSIS — R21 Rash and other nonspecific skin eruption: Secondary | ICD-10-CM | POA: Diagnosis not present

## 2018-12-26 ENCOUNTER — Telehealth (INDEPENDENT_AMBULATORY_CARE_PROVIDER_SITE_OTHER): Payer: Medicare Other | Admitting: Cardiology

## 2018-12-26 ENCOUNTER — Other Ambulatory Visit: Payer: Self-pay

## 2018-12-26 VITALS — BP 119/75 | HR 75 | Wt 169.0 lb

## 2018-12-26 DIAGNOSIS — I10 Essential (primary) hypertension: Secondary | ICD-10-CM

## 2018-12-26 DIAGNOSIS — R0789 Other chest pain: Secondary | ICD-10-CM

## 2018-12-26 DIAGNOSIS — E782 Mixed hyperlipidemia: Secondary | ICD-10-CM

## 2018-12-26 NOTE — Progress Notes (Signed)
Virtual Visit via Telephone Note   This visit type was conducted due to national recommendations for restrictions regarding the COVID-19 Pandemic (e.g. social distancing) in an effort to limit this patient's exposure and mitigate transmission in our community.  Due to her co-morbid illnesses, this patient is at least at moderate risk for complications without adequate follow up.  This format is felt to be most appropriate for this patient at this time.  The patient did not have access to video technology/had technical difficulties with video requiring transitioning to audio format only (telephone).  All issues noted in this document were discussed and addressed.  No physical exam could be performed with this format.  Please refer to the patient's chart for her  consent to telehealth for [facility].   Date:  12/26/2018   ID:  Margaret Wilkinson, DOB 05/01/1946, MRN [id number]  Patient Location: Home Provider Location: Office  PCP:  Ailene RavelHamrick, Maura L, MD  Cardiologist:  No primary care provider on file.  Electrophysiologist:  None   Evaluation Performed:  Follow-Up Visit  Chief Complaint: Chest discomfort  History of Present Illness:    Margaret Wilkinson is a 73 y.o. female with past medical history of essential hypertension and dyslipidemia.  She was evaluated by me for chest discomfort and her tests were essentially unremarkable.  She tells me that she feels much better now.  No chest pain orthopnea or PND.  Her blood pressure stable.  She is walking some on a regular basis.  At the time of my evaluation, the patient is alert awake oriented and in no distress.  The patient does not have symptoms concerning for COVID-19 infection (fever, chills, cough, or new shortness of breath).    Past Medical History:  Diagnosis Date  . Arthritis    "knees" (12/26/2014)  . Hypercholesteremia   . Hypertension   . Knee pain   . Liver cyst   . Migraine    "q 3 months or so" (12/26/2014)  . Osteopenia    . Prediabetes   . Sciatica of left side   . Tinnitus of both ears   . Vitamin D deficiency    Past Surgical History:  Procedure Laterality Date  . ABDOMINAL HYSTERECTOMY  ~ 2004  . LAPAROSCOPIC CHOLECYSTECTOMY  ~ 2004  . LAPAROSCOPIC HEPATECTOMY N/A 12/26/2014   Procedure: LAPAROSCOPIC LIVER CYST UNROOFING;  Surgeon: Almond LintFaera Byerly, MD;  Location: [facility] OR;  Service: General;  Laterality: N/A;  . LAPAROSCOPIC LIVER CYST UNROOFING  12/26/2014  . TONSILLECTOMY  ~ 1951     No outpatient medications have been marked as taking for the 12/26/18 encounter (Telemedicine) with , Aundra Dubinajan R, MD.     Allergies:   Patient has no known allergies.   Social History   Tobacco Use  . Smoking status: Never Smoker  . Smokeless tobacco: Never Used  Substance Use Topics  . Alcohol use: No  . Drug use: No     Family Hx: The patient's family history is not on file.  ROS:   Please see the history of present illness.    As mentioned above All other systems reviewed and are negative.   Prior CV studies:   The following studies were reviewed today:  Stress test was unremarkable and revealed no evidence of ischemia and normal ejection fraction  Labs/Other Tests and Data Reviewed:    EKG:  No ECG reviewed.  Recent Labs: No results found for requested labs within last 8760 hours.   Recent  Lipid Panel No results found for: CHOL, TRIG, HDL, CHOLHDL, LDLCALC, LDLDIRECT  Wt Readings from Last 3 Encounters:  12/26/18 169 lb (76.7 kg)  11/28/18 167 lb (75.8 kg)  10/24/18 167 lb (75.8 kg)     Objective:    Vital Signs:  BP 119/75 (BP Location: Left Arm) Comment (Patient Position): sitting  Pulse 75   Wt 169 lb (76.7 kg)   BMI 30.91 kg/m    VITAL SIGNS:  reviewed  ASSESSMENT & PLAN:    1. Chest discomfort: This is resolved and the patient is back to active lifestyle without any problems.  She feels much better.  She is happy about it and walking on a regular basis. 2. Essential  hypertension: Blood pressure stable 3. Mixed dyslipidemia: Lipids are followed by primary care physician. 4. Patient will be seen in follow-up appointment in 6 months or earlier if the patient has any concerns   COVID-19 Education: The signs and symptoms of COVID-19 were discussed with the patient and how to seek care for testing (follow up with PCP or arrange E-visit).  The importance of social distancing was discussed today.  Time:   Today, I have spent 12 minutes with the patient with telehealth technology discussing the above problems.     Medication Adjustments/Labs and Tests Ordered: Current medicines are reviewed at length with the patient today.  Concerns regarding medicines are outlined above.   Tests Ordered: No orders of the defined types were placed in this encounter.   Medication Changes: No orders of the defined types were placed in this encounter.   Follow Up:  Virtual Visit or In Person in 3 month(s)  Signed, Jenean Lindau, MD  12/26/2018 11:20 AM    [place]

## 2018-12-26 NOTE — Patient Instructions (Signed)

## 2019-01-01 DIAGNOSIS — N289 Disorder of kidney and ureter, unspecified: Secondary | ICD-10-CM | POA: Diagnosis not present

## 2019-01-08 DIAGNOSIS — I10 Essential (primary) hypertension: Secondary | ICD-10-CM | POA: Diagnosis not present

## 2019-01-08 DIAGNOSIS — N390 Urinary tract infection, site not specified: Secondary | ICD-10-CM | POA: Diagnosis not present

## 2019-01-08 DIAGNOSIS — K219 Gastro-esophageal reflux disease without esophagitis: Secondary | ICD-10-CM | POA: Diagnosis not present

## 2019-01-08 DIAGNOSIS — D72829 Elevated white blood cell count, unspecified: Secondary | ICD-10-CM | POA: Diagnosis not present

## 2019-01-31 ENCOUNTER — Ambulatory Visit: Payer: Medicare Other

## 2019-01-31 ENCOUNTER — Other Ambulatory Visit: Payer: Medicare Other

## 2019-02-05 DIAGNOSIS — E559 Vitamin D deficiency, unspecified: Secondary | ICD-10-CM | POA: Diagnosis not present

## 2019-02-05 DIAGNOSIS — E7800 Pure hypercholesterolemia, unspecified: Secondary | ICD-10-CM | POA: Diagnosis not present

## 2019-02-05 DIAGNOSIS — I10 Essential (primary) hypertension: Secondary | ICD-10-CM | POA: Diagnosis not present

## 2019-02-07 DIAGNOSIS — R7303 Prediabetes: Secondary | ICD-10-CM | POA: Diagnosis not present

## 2019-02-07 DIAGNOSIS — Z23 Encounter for immunization: Secondary | ICD-10-CM | POA: Diagnosis not present

## 2019-02-07 DIAGNOSIS — E7800 Pure hypercholesterolemia, unspecified: Secondary | ICD-10-CM | POA: Diagnosis not present

## 2019-02-07 DIAGNOSIS — G43909 Migraine, unspecified, not intractable, without status migrainosus: Secondary | ICD-10-CM | POA: Diagnosis not present

## 2019-02-07 DIAGNOSIS — I10 Essential (primary) hypertension: Secondary | ICD-10-CM | POA: Diagnosis not present

## 2019-04-12 ENCOUNTER — Ambulatory Visit
Admission: RE | Admit: 2019-04-12 | Discharge: 2019-04-12 | Disposition: A | Discharge status: Home / Self Care | Payer: Medicare Other | Source: Ambulatory Visit | Attending: Physician Assistant | Admitting: Physician Assistant

## 2019-04-12 ENCOUNTER — Other Ambulatory Visit: Payer: Self-pay

## 2019-04-12 ENCOUNTER — Other Ambulatory Visit: Payer: Medicare Other

## 2019-04-12 DIAGNOSIS — Z1231 Encounter for screening mammogram for malignant neoplasm of breast: Secondary | ICD-10-CM

## 2019-04-26 ENCOUNTER — Other Ambulatory Visit: Payer: Self-pay

## 2019-04-26 NOTE — Patient Outreach (Signed)
[facility]) Care Management  04/26/2019  Margaret Wilkinson January 11, 1946 [id number]   Medication Adherence call to Margaret Wilkinson Hipp[facility] Identifiers Verify spoke with patient she is past due on Lisinopril/Hctz 20/12.5 mg,patient explain she is no longer taking this medication patient was having side effects,patient explain she is now taking Atenolol.Margaret Wilkinson is showing past due under [employer].   [place] Management Direct Dial [phone]  Fax [phone] [email or web address]

## 2019-07-10 ENCOUNTER — Other Ambulatory Visit: Payer: Medicare Other

## 2019-08-06 DIAGNOSIS — Z139 Encounter for screening, unspecified: Secondary | ICD-10-CM | POA: Diagnosis not present

## 2019-08-06 DIAGNOSIS — Z0000 Encounter for general adult medical examination without abnormal findings: Secondary | ICD-10-CM | POA: Diagnosis not present

## 2019-08-06 DIAGNOSIS — Z9181 History of falling: Secondary | ICD-10-CM | POA: Diagnosis not present

## 2019-08-06 DIAGNOSIS — E7800 Pure hypercholesterolemia, unspecified: Secondary | ICD-10-CM | POA: Diagnosis not present

## 2019-08-06 DIAGNOSIS — E785 Hyperlipidemia, unspecified: Secondary | ICD-10-CM | POA: Diagnosis not present

## 2019-08-06 DIAGNOSIS — N959 Unspecified menopausal and perimenopausal disorder: Secondary | ICD-10-CM | POA: Diagnosis not present

## 2019-08-06 DIAGNOSIS — R7303 Prediabetes: Secondary | ICD-10-CM | POA: Diagnosis not present

## 2019-08-08 DIAGNOSIS — I10 Essential (primary) hypertension: Secondary | ICD-10-CM | POA: Diagnosis not present

## 2019-08-08 DIAGNOSIS — G43909 Migraine, unspecified, not intractable, without status migrainosus: Secondary | ICD-10-CM | POA: Diagnosis not present

## 2019-08-08 DIAGNOSIS — M8580 Other specified disorders of bone density and structure, unspecified site: Secondary | ICD-10-CM | POA: Diagnosis not present

## 2019-08-08 DIAGNOSIS — Z9189 Other specified personal risk factors, not elsewhere classified: Secondary | ICD-10-CM | POA: Diagnosis not present

## 2019-08-08 DIAGNOSIS — E778 Other disorders of glycoprotein metabolism: Secondary | ICD-10-CM | POA: Diagnosis not present

## 2019-08-14 DIAGNOSIS — L0211 Cutaneous abscess of neck: Secondary | ICD-10-CM | POA: Diagnosis not present

## 2019-08-14 DIAGNOSIS — L03213 Periorbital cellulitis: Secondary | ICD-10-CM | POA: Diagnosis not present

## 2019-08-24 ENCOUNTER — Other Ambulatory Visit: Payer: Self-pay | Admitting: Physician Assistant

## 2019-08-24 DIAGNOSIS — E2839 Other primary ovarian failure: Secondary | ICD-10-CM

## 2019-09-10 ENCOUNTER — Other Ambulatory Visit: Payer: Self-pay

## 2019-09-10 ENCOUNTER — Encounter: Payer: Self-pay | Admitting: Cardiology

## 2019-09-10 ENCOUNTER — Ambulatory Visit: Payer: Medicare Other | Admitting: Cardiology

## 2019-09-10 VITALS — BP 128/74 | HR 71 | Temp 97.0°F | Ht 62.0 in | Wt 170.0 lb

## 2019-09-10 DIAGNOSIS — E782 Mixed hyperlipidemia: Secondary | ICD-10-CM

## 2019-09-10 DIAGNOSIS — I10 Essential (primary) hypertension: Secondary | ICD-10-CM | POA: Diagnosis not present

## 2019-09-10 NOTE — Patient Instructions (Signed)
Medication Instructions:  No medication changes *If you need a refill on your cardiac medications before your next appointment, please call your pharmacy*   Lab Work: None ordered If you have labs (blood work) drawn today and your tests are completely normal, you will receive your results only by: . MyChart Message (if you have MyChart) OR . A paper copy in the mail If you have any lab test that is abnormal or we need to change your treatment, we will call you to review the results.   Testing/Procedures: None ordered   Follow-Up: At [facility], you and your health needs are our priority.  As part of our continuing mission to provide you with exceptional heart care, we have created designated Provider Care Teams.  These Care Teams include your primary Cardiologist (physician) and Advanced Practice Providers (APPs -  Physician Assistants and Nurse Practitioners) who all work together to provide you with the care you need, when you need it.  We recommend signing up for the patient portal called "MyChart".  Sign up information is provided on this After Visit Summary.  MyChart is used to connect with patients for Virtual Visits (Telemedicine).  Patients are able to view lab/test results, encounter notes, upcoming appointments, etc.  Non-urgent messages can be sent to your provider as well.   To learn more about what you can do with MyChart, go to [email or web address].    Your next appointment:   9 month(s)  The format for your next appointment:   In Person  Provider:   Rajan Revankar, MD   Other Instructions NA 

## 2019-09-10 NOTE — Progress Notes (Signed)
Cardiology Office Note:    Date:  09/10/2019   ID:  Margaret Wilkinson, DOB 11/21/1945, MRN [id number]  PCP:  Leonides Sake, MD  Cardiologist:  Jenean Lindau, MD   Referring MD: Leonides Sake, MD    ASSESSMENT:    1. Essential hypertension   2. Mixed dyslipidemia    PLAN:    In order of problems listed above:  1. Primary prevention stressed with the patient.  Importance of compliance with diet medication stressed and she vocalized understanding. 2. Essential hypertension: Blood pressure is stable and she is following diet and lifestyle modification 3. Mixed dyslipidemia diet was emphasized and I reviewed her lab work that we brought along with her. 4. Overweight: Diet was discussed weight reduction was stressed importance of regular exercise stressed walking at least half an hour a day on a daily basis 5 days weeks and she promises to do so. 5. Patient will be seen in follow-up appointment in 9 months or earlier if the patient has any concerns    Medication Adjustments/Labs and Tests Ordered: Current medicines are reviewed at length with the patient today.  Concerns regarding medicines are outlined above.  No orders of the defined types were placed in this encounter.  No orders of the defined types were placed in this encounter.    No chief complaint on file.    History of Present Illness:    Margaret Wilkinson is a 74 y.o. female.  Patient was evaluated by me for chest discomfort.  She has history of essential hypertension dyslipidemia and is overweight.  She leads a sedentary lifestyle.  No chest pain orthopnea or PND.  At the time of my evaluation, the patient is alert awake oriented and in no distress.  Past Medical History:  Diagnosis Date  . Arthritis    "knees" (12/26/2014)  . Hypercholesteremia   . Hypertension   . Knee pain   . Liver cyst   . Migraine    "q 3 months or so" (12/26/2014)  . Osteopenia   . Prediabetes   . Sciatica of left side   .  Tinnitus of both ears   . Vitamin D deficiency     Past Surgical History:  Procedure Laterality Date  . ABDOMINAL HYSTERECTOMY  ~ 2004  . LAPAROSCOPIC CHOLECYSTECTOMY  ~ 2004  . LAPAROSCOPIC HEPATECTOMY N/A 12/26/2014   Procedure: LAPAROSCOPIC LIVER CYST UNROOFING;  Surgeon: Stark Klein, MD;  Location: [place];  Service: General;  Laterality: N/A;  . LAPAROSCOPIC LIVER CYST UNROOFING  12/26/2014  . TONSILLECTOMY  ~ 1951    Current Medications: Current Meds  Medication Sig  . atenolol (TENORMIN) 50 MG tablet Take 50 mg by mouth daily.  . Calcium Carb-Cholecalciferol (CALCIUM 600 + D PO) Take 1 tablet by mouth daily.  . cholecalciferol (VITAMIN D) 1000 UNITS tablet Take 1,000 Units by mouth daily.  . rizatriptan (MAXALT) 10 MG tablet Take 10 mg by mouth as needed for migraine. May repeat in 2 hours if needed     Allergies:   Patient has no known allergies.   Social History   Socioeconomic History  . Marital status: Widowed    Spouse name: Not on file  . Number of children: Not on file  . Years of education: Not on file  . Highest education level: Not on file  Occupational History  . Not on file  Tobacco Use  . Smoking status: Never Smoker  . Smokeless tobacco: Never Used  Substance and  Sexual Activity  . Alcohol use: No  . Drug use: No  . Sexual activity: Not on file  Other Topics Concern  . Not on file  Social History Narrative  . Not on file   Social Determinants of Health   Financial Resource Strain:   . Difficulty of Paying Living Expenses:   Food Insecurity:   . Worried About Programme researcher, broadcasting/film/video in the Last Year:   . Barista in the Last Year:   Transportation Needs:   . Freight forwarder (Medical):   Marland Kitchen Lack of Transportation (Non-Medical):   Physical Activity:   . Days of Exercise per Week:   . Minutes of Exercise per Session:   Stress:   . Feeling of Stress :   Social Connections:   . Frequency of Communication with Friends and Family:   .  Frequency of Social Gatherings with Friends and Family:   . Attends Religious Services:   . Active Member of Clubs or Organizations:   . Attends Banker Meetings:   Marland Kitchen Marital Status:      Family History: The patient's family history is not on file.  ROS:   Please see the history of present illness.    All other systems reviewed and are negative.  EKGs/Labs/Other Studies Reviewed:    The following studies were reviewed today: Stress test was within normal limits and results detailed above.   Recent Labs: No results found for requested labs within last 8760 hours.  Recent Lipid Panel No results found for: CHOL, TRIG, HDL, CHOLHDL, VLDL, LDLCALC, LDLDIRECT  Physical Exam:    VS:  BP 128/74   Pulse 71   Temp (!) 97 F (36.1 C)   Ht 5\' 2"  (1.575 m)   Wt 170 lb (77.1 kg)   SpO2 100%   BMI 31.09 kg/m     Wt Readings from Last 3 Encounters:  09/10/19 170 lb (77.1 kg)  12/26/18 169 lb (76.7 kg)  11/28/18 167 lb (75.8 kg)     GEN: Patient is in no acute distress HEENT: Normal NECK: No JVD; No carotid bruits LYMPHATICS: No lymphadenopathy CARDIAC: Hear sounds regular, 2/6 systolic murmur at the apex. RESPIRATORY:  Clear to auscultation without rales, wheezing or rhonchi  ABDOMEN: Soft, non-tender, non-distended MUSCULOSKELETAL:  No edema; No deformity  SKIN: Warm and dry NEUROLOGIC:  Alert and oriented x 3 PSYCHIATRIC:  Normal affect   Signed, 01/29/19, MD  09/10/2019 4:16 PM     [facility]

## 2019-11-12 ENCOUNTER — Other Ambulatory Visit: Payer: Self-pay | Admitting: Family Medicine

## 2019-11-12 ENCOUNTER — Other Ambulatory Visit: Payer: Self-pay

## 2019-11-12 ENCOUNTER — Ambulatory Visit
Admission: RE | Admit: 2019-11-12 | Discharge: 2019-11-12 | Disposition: A | Discharge status: Home / Self Care | Payer: Medicare Other | Source: Ambulatory Visit | Attending: Physician Assistant | Admitting: Physician Assistant

## 2019-11-12 DIAGNOSIS — M810 Age-related osteoporosis without current pathological fracture: Secondary | ICD-10-CM | POA: Diagnosis not present

## 2019-11-12 DIAGNOSIS — E2839 Other primary ovarian failure: Secondary | ICD-10-CM

## 2019-11-12 DIAGNOSIS — Z780 Asymptomatic menopausal state: Secondary | ICD-10-CM | POA: Diagnosis not present

## 2019-12-05 ENCOUNTER — Ambulatory Visit: Payer: Medicare Other | Attending: Internal Medicine

## 2019-12-05 DIAGNOSIS — Z23 Encounter for immunization: Secondary | ICD-10-CM

## 2019-12-05 NOTE — Progress Notes (Signed)
° °  Covid-19 Vaccination Clinic  Name:  Margaret Wilkinson    MRN: [id number] DOB: 1946/01/10  12/05/2019  Margaret Wilkinson was observed post Covid-19 immunization for 15 minutes without incident. She was provided with Vaccine Information Sheet and instruction to access the V-Safe system.   Margaret Wilkinson was instructed to call 911 with any severe reactions post vaccine:  Difficulty breathing   Swelling of face and throat   A fast heartbeat   A bad rash all over body   Dizziness and weakness   Immunizations Administered    Name Date Dose VIS Date Route   Moderna COVID-19 Vaccine 12/05/2019  2:30 PM 0.5 mL 04/2019 Intramuscular   Manufacturer: Moderna   Lot: [id number]   NDC: 59163-846-65

## 2019-12-20 DIAGNOSIS — H2513 Age-related nuclear cataract, bilateral: Secondary | ICD-10-CM | POA: Diagnosis not present

## 2019-12-20 DIAGNOSIS — H35363 Drusen (degenerative) of macula, bilateral: Secondary | ICD-10-CM | POA: Diagnosis not present

## 2020-01-05 ENCOUNTER — Ambulatory Visit: Payer: Medicare Other

## 2020-02-04 DIAGNOSIS — E559 Vitamin D deficiency, unspecified: Secondary | ICD-10-CM | POA: Diagnosis not present

## 2020-02-04 DIAGNOSIS — R7303 Prediabetes: Secondary | ICD-10-CM | POA: Diagnosis not present

## 2020-02-04 DIAGNOSIS — E7800 Pure hypercholesterolemia, unspecified: Secondary | ICD-10-CM | POA: Diagnosis not present

## 2020-02-07 DIAGNOSIS — M5432 Sciatica, left side: Secondary | ICD-10-CM | POA: Diagnosis not present

## 2020-02-07 DIAGNOSIS — G43909 Migraine, unspecified, not intractable, without status migrainosus: Secondary | ICD-10-CM | POA: Diagnosis not present

## 2020-02-07 DIAGNOSIS — Z23 Encounter for immunization: Secondary | ICD-10-CM | POA: Diagnosis not present

## 2020-02-07 DIAGNOSIS — I10 Essential (primary) hypertension: Secondary | ICD-10-CM | POA: Diagnosis not present

## 2020-02-07 DIAGNOSIS — M810 Age-related osteoporosis without current pathological fracture: Secondary | ICD-10-CM | POA: Diagnosis not present

## 2020-02-07 DIAGNOSIS — Z9181 History of falling: Secondary | ICD-10-CM | POA: Diagnosis not present

## 2020-04-03 ENCOUNTER — Other Ambulatory Visit: Payer: Self-pay | Admitting: Family Medicine

## 2020-04-03 DIAGNOSIS — Z1231 Encounter for screening mammogram for malignant neoplasm of breast: Secondary | ICD-10-CM

## 2020-04-21 DIAGNOSIS — Z1212 Encounter for screening for malignant neoplasm of rectum: Secondary | ICD-10-CM | POA: Diagnosis not present

## 2020-04-21 DIAGNOSIS — Z1211 Encounter for screening for malignant neoplasm of colon: Secondary | ICD-10-CM | POA: Diagnosis not present

## 2020-04-26 LAB — EXTERNAL GENERIC LAB PROCEDURE: COLOGUARD: NEGATIVE

## 2020-04-26 LAB — COLOGUARD: COLOGUARD: NEGATIVE

## 2020-05-22 ENCOUNTER — Ambulatory Visit: Payer: Medicare Other

## 2020-06-25 ENCOUNTER — Ambulatory Visit: Payer: Medicare Other

## 2020-06-26 ENCOUNTER — Ambulatory Visit
Admission: RE | Admit: 2020-06-26 | Discharge: 2020-06-26 | Disposition: A | Discharge status: Home / Self Care | Payer: Medicare Other | Source: Ambulatory Visit | Attending: Family Medicine | Admitting: Family Medicine

## 2020-06-26 ENCOUNTER — Other Ambulatory Visit: Payer: Self-pay

## 2020-06-26 DIAGNOSIS — Z1231 Encounter for screening mammogram for malignant neoplasm of breast: Secondary | ICD-10-CM

## 2020-07-16 DIAGNOSIS — M2550 Pain in unspecified joint: Secondary | ICD-10-CM | POA: Diagnosis not present

## 2020-07-16 DIAGNOSIS — R5383 Other fatigue: Secondary | ICD-10-CM | POA: Diagnosis not present

## 2020-07-16 DIAGNOSIS — E7800 Pure hypercholesterolemia, unspecified: Secondary | ICD-10-CM | POA: Diagnosis not present

## 2020-07-16 DIAGNOSIS — M791 Myalgia, unspecified site: Secondary | ICD-10-CM | POA: Diagnosis not present

## 2020-07-16 DIAGNOSIS — E559 Vitamin D deficiency, unspecified: Secondary | ICD-10-CM | POA: Diagnosis not present

## 2020-08-04 DIAGNOSIS — S01511A Laceration without foreign body of lip, initial encounter: Secondary | ICD-10-CM | POA: Diagnosis not present

## 2020-08-04 DIAGNOSIS — S60222A Contusion of left hand, initial encounter: Secondary | ICD-10-CM | POA: Diagnosis not present

## 2020-08-04 DIAGNOSIS — S00511A Abrasion of lip, initial encounter: Secondary | ICD-10-CM | POA: Diagnosis not present

## 2020-08-07 DIAGNOSIS — Z0000 Encounter for general adult medical examination without abnormal findings: Secondary | ICD-10-CM | POA: Diagnosis not present

## 2020-08-07 DIAGNOSIS — D72829 Elevated white blood cell count, unspecified: Secondary | ICD-10-CM | POA: Diagnosis not present

## 2020-08-07 DIAGNOSIS — S01511D Laceration without foreign body of lip, subsequent encounter: Secondary | ICD-10-CM | POA: Diagnosis not present

## 2020-08-07 DIAGNOSIS — Z9189 Other specified personal risk factors, not elsewhere classified: Secondary | ICD-10-CM | POA: Diagnosis not present

## 2020-08-07 DIAGNOSIS — I10 Essential (primary) hypertension: Secondary | ICD-10-CM | POA: Diagnosis not present

## 2020-08-07 DIAGNOSIS — Z9181 History of falling: Secondary | ICD-10-CM | POA: Diagnosis not present

## 2020-08-07 DIAGNOSIS — E785 Hyperlipidemia, unspecified: Secondary | ICD-10-CM | POA: Diagnosis not present

## 2020-08-07 DIAGNOSIS — M810 Age-related osteoporosis without current pathological fracture: Secondary | ICD-10-CM | POA: Diagnosis not present

## 2020-08-07 DIAGNOSIS — G43909 Migraine, unspecified, not intractable, without status migrainosus: Secondary | ICD-10-CM | POA: Diagnosis not present

## 2020-09-15 DIAGNOSIS — Z79899 Other long term (current) drug therapy: Secondary | ICD-10-CM | POA: Diagnosis not present

## 2020-11-05 DIAGNOSIS — M810 Age-related osteoporosis without current pathological fracture: Secondary | ICD-10-CM | POA: Diagnosis not present

## 2021-01-12 DIAGNOSIS — E559 Vitamin D deficiency, unspecified: Secondary | ICD-10-CM | POA: Diagnosis not present

## 2021-01-12 DIAGNOSIS — E7800 Pure hypercholesterolemia, unspecified: Secondary | ICD-10-CM | POA: Diagnosis not present

## 2021-01-12 DIAGNOSIS — R739 Hyperglycemia, unspecified: Secondary | ICD-10-CM | POA: Diagnosis not present

## 2021-01-15 DIAGNOSIS — M810 Age-related osteoporosis without current pathological fracture: Secondary | ICD-10-CM | POA: Diagnosis not present

## 2021-01-15 DIAGNOSIS — G43909 Migraine, unspecified, not intractable, without status migrainosus: Secondary | ICD-10-CM | POA: Diagnosis not present

## 2021-01-15 DIAGNOSIS — E559 Vitamin D deficiency, unspecified: Secondary | ICD-10-CM | POA: Diagnosis not present

## 2021-01-15 DIAGNOSIS — Z9189 Other specified personal risk factors, not elsewhere classified: Secondary | ICD-10-CM | POA: Diagnosis not present

## 2021-01-15 DIAGNOSIS — I10 Essential (primary) hypertension: Secondary | ICD-10-CM | POA: Diagnosis not present

## 2021-01-15 DIAGNOSIS — Z139 Encounter for screening, unspecified: Secondary | ICD-10-CM | POA: Diagnosis not present

## 2021-03-13 DIAGNOSIS — Z23 Encounter for immunization: Secondary | ICD-10-CM | POA: Diagnosis not present

## 2021-03-16 IMAGING — MG MM DIGITAL SCREENING BILAT W/ TOMO AND CAD
8 series · 8 of 24 positions shown · non-contrast
Comparison: Previous exam(s).

ACR Breast Density Category a: The breast tissue is almost entirely
fatty.

CLINICAL DATA: Screening.

EXAM:
DIGITAL SCREENING BILATERAL MAMMOGRAM WITH TOMOSYNTHESIS AND CAD

[L MLO synth-2D]
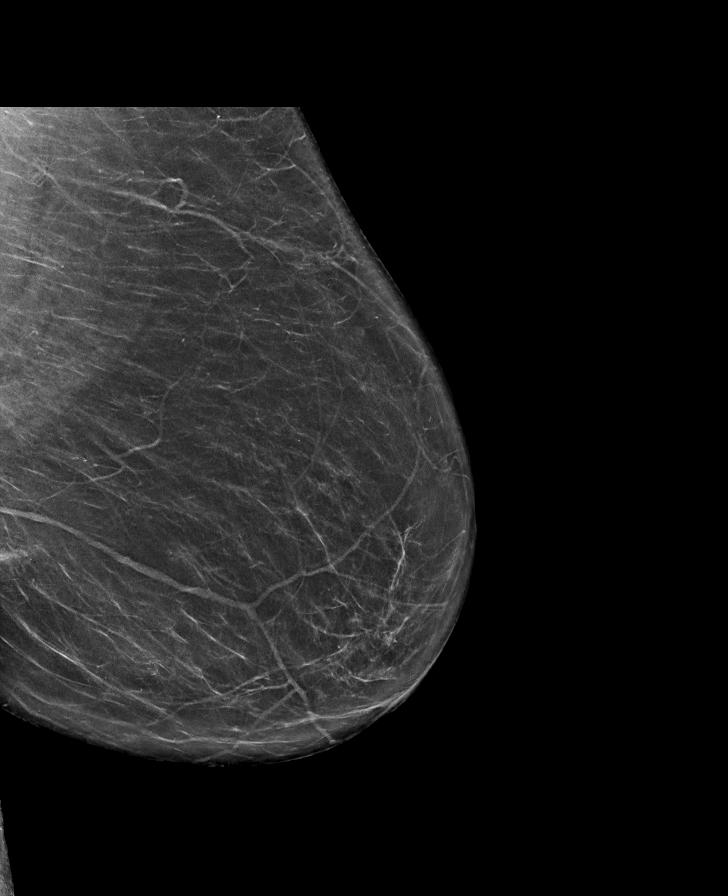

[R MLO synth-2D]
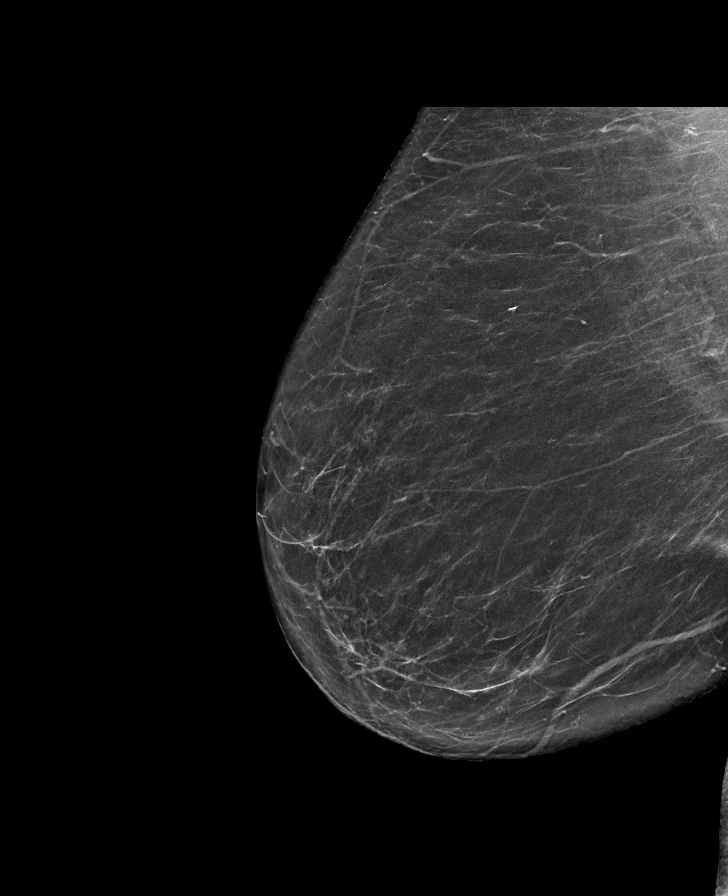

[R CC synth-2D]
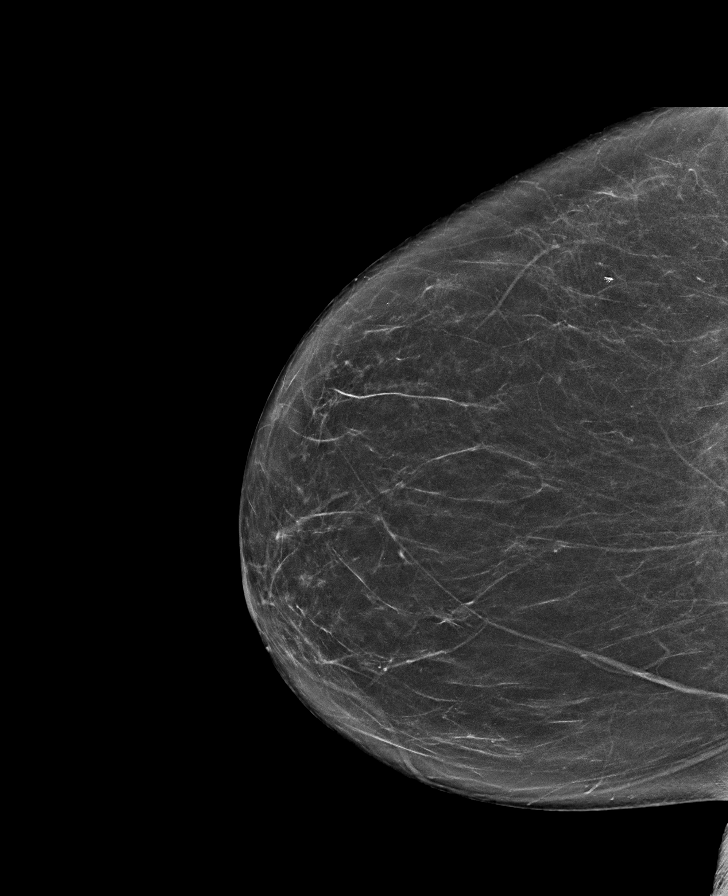

[L CC synth-2D]
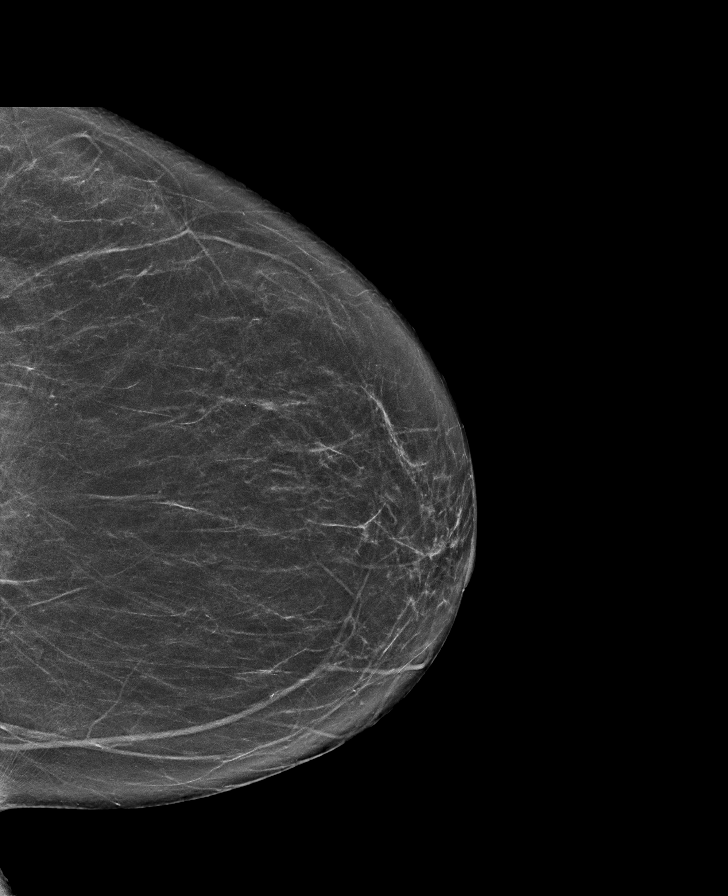

[R CC tomo · tomo slice 33/64.0]
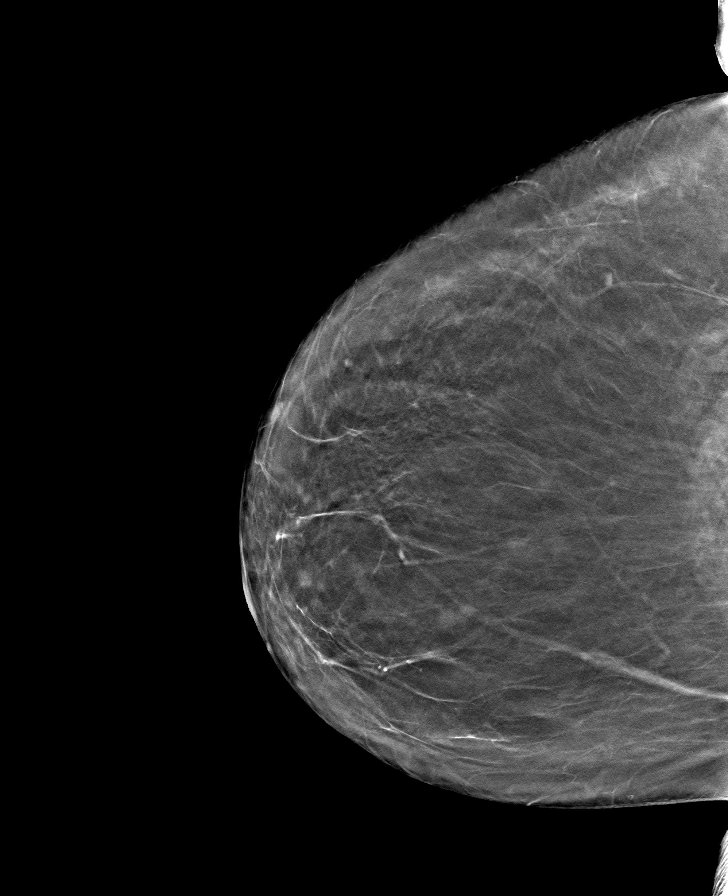

[L CC tomo · tomo slice 33/65.0]
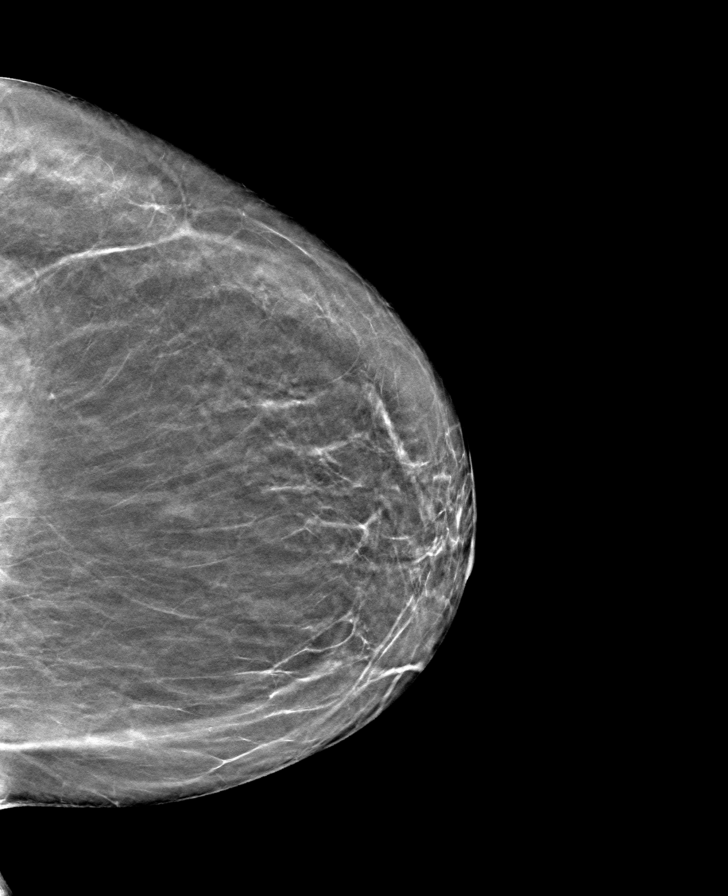

[R MLO tomo · tomo slice 36/71.0]
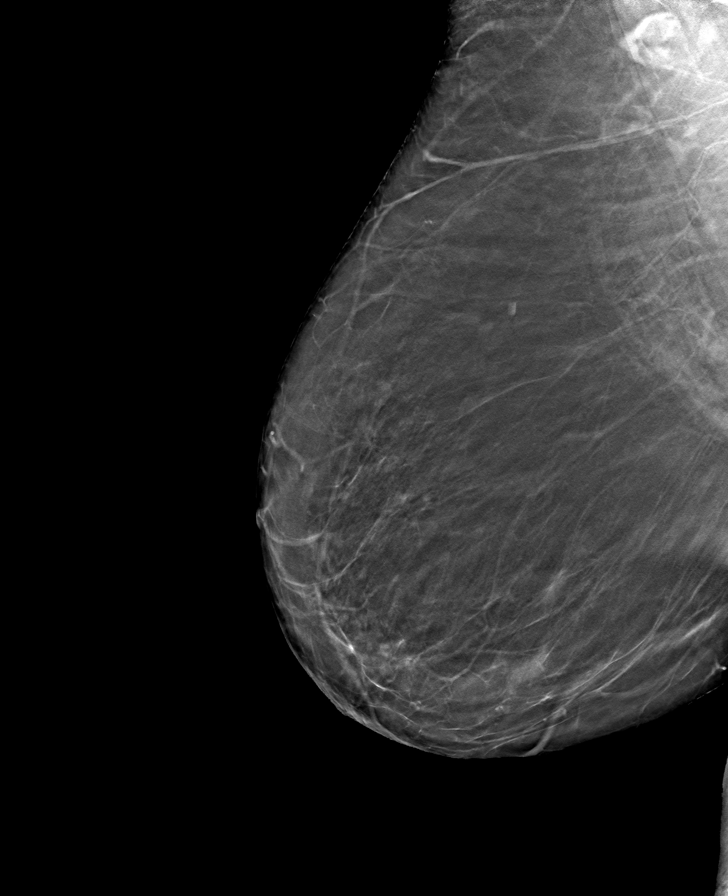

[L MLO tomo · tomo slice 37/74.0]
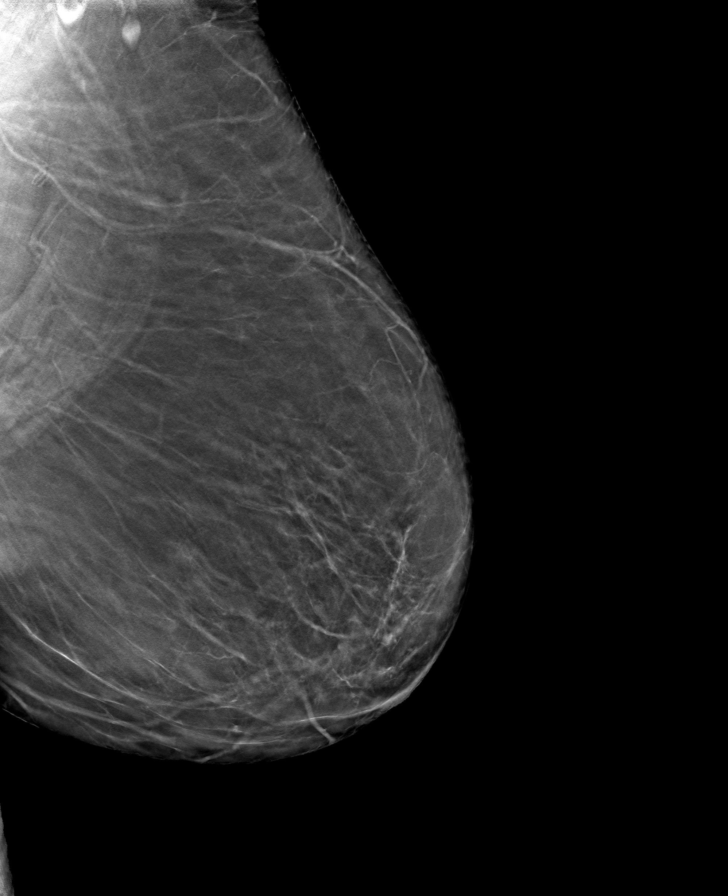

[8 of 24 positions shown; findings below may reference images not displayed]

FINDINGS: There are no findings suspicious for malignancy. The images were
evaluated with computer-aided detection.
IMPRESSION: No mammographic evidence of malignancy. A result letter of this
screening mammogram will be mailed directly to the patient.

RECOMMENDATION:
Screening mammogram in one year. (Code:[id number])

BI-RADS CATEGORY  1: Negative.

## 2021-05-08 DIAGNOSIS — M810 Age-related osteoporosis without current pathological fracture: Secondary | ICD-10-CM | POA: Diagnosis not present

## 2021-05-26 ENCOUNTER — Other Ambulatory Visit: Payer: Self-pay | Admitting: Family Medicine

## 2021-05-26 DIAGNOSIS — Z1231 Encounter for screening mammogram for malignant neoplasm of breast: Secondary | ICD-10-CM

## 2021-07-20 DIAGNOSIS — Z79899 Other long term (current) drug therapy: Secondary | ICD-10-CM | POA: Diagnosis not present

## 2021-07-20 DIAGNOSIS — E7800 Pure hypercholesterolemia, unspecified: Secondary | ICD-10-CM | POA: Diagnosis not present

## 2021-07-21 ENCOUNTER — Ambulatory Visit: Payer: Medicare Other

## 2021-07-23 DIAGNOSIS — M810 Age-related osteoporosis without current pathological fracture: Secondary | ICD-10-CM | POA: Diagnosis not present

## 2021-07-23 DIAGNOSIS — G43909 Migraine, unspecified, not intractable, without status migrainosus: Secondary | ICD-10-CM | POA: Diagnosis not present

## 2021-07-23 DIAGNOSIS — E7800 Pure hypercholesterolemia, unspecified: Secondary | ICD-10-CM | POA: Diagnosis not present

## 2021-07-23 DIAGNOSIS — I10 Essential (primary) hypertension: Secondary | ICD-10-CM | POA: Diagnosis not present

## 2021-07-30 ENCOUNTER — Ambulatory Visit: Payer: Medicare Other

## 2021-08-10 DIAGNOSIS — E785 Hyperlipidemia, unspecified: Secondary | ICD-10-CM | POA: Diagnosis not present

## 2021-08-10 DIAGNOSIS — Z9181 History of falling: Secondary | ICD-10-CM | POA: Diagnosis not present

## 2021-08-10 DIAGNOSIS — Z0000 Encounter for general adult medical examination without abnormal findings: Secondary | ICD-10-CM | POA: Diagnosis not present

## 2021-08-18 ENCOUNTER — Ambulatory Visit: Payer: Medicare Other

## 2021-08-25 ENCOUNTER — Ambulatory Visit
Admission: RE | Admit: 2021-08-25 | Discharge: 2021-08-25 | Disposition: A | Discharge status: Home / Self Care | Payer: Medicare Other | Source: Ambulatory Visit | Attending: Family Medicine | Admitting: Family Medicine

## 2021-08-25 DIAGNOSIS — Z1231 Encounter for screening mammogram for malignant neoplasm of breast: Secondary | ICD-10-CM | POA: Diagnosis not present

## 2021-09-01 DIAGNOSIS — H5203 Hypermetropia, bilateral: Secondary | ICD-10-CM | POA: Diagnosis not present

## 2021-09-01 DIAGNOSIS — H52223 Regular astigmatism, bilateral: Secondary | ICD-10-CM | POA: Diagnosis not present

## 2021-09-01 DIAGNOSIS — H353131 Nonexudative age-related macular degeneration, bilateral, early dry stage: Secondary | ICD-10-CM | POA: Diagnosis not present

## 2021-09-01 DIAGNOSIS — H2513 Age-related nuclear cataract, bilateral: Secondary | ICD-10-CM | POA: Diagnosis not present

## 2021-09-01 DIAGNOSIS — H524 Presbyopia: Secondary | ICD-10-CM | POA: Diagnosis not present

## 2021-10-27 DIAGNOSIS — Z79899 Other long term (current) drug therapy: Secondary | ICD-10-CM | POA: Diagnosis not present

## 2021-11-09 DIAGNOSIS — M810 Age-related osteoporosis without current pathological fracture: Secondary | ICD-10-CM | POA: Diagnosis not present

## 2022-02-26 DIAGNOSIS — Z79899 Other long term (current) drug therapy: Secondary | ICD-10-CM | POA: Diagnosis not present

## 2022-02-26 DIAGNOSIS — E7800 Pure hypercholesterolemia, unspecified: Secondary | ICD-10-CM | POA: Diagnosis not present

## 2022-03-04 DIAGNOSIS — Z139 Encounter for screening, unspecified: Secondary | ICD-10-CM | POA: Diagnosis not present

## 2022-03-04 DIAGNOSIS — E7800 Pure hypercholesterolemia, unspecified: Secondary | ICD-10-CM | POA: Diagnosis not present

## 2022-03-04 DIAGNOSIS — Z23 Encounter for immunization: Secondary | ICD-10-CM | POA: Diagnosis not present

## 2022-03-04 DIAGNOSIS — M810 Age-related osteoporosis without current pathological fracture: Secondary | ICD-10-CM | POA: Diagnosis not present

## 2022-03-04 DIAGNOSIS — G43909 Migraine, unspecified, not intractable, without status migrainosus: Secondary | ICD-10-CM | POA: Diagnosis not present

## 2022-03-04 DIAGNOSIS — I10 Essential (primary) hypertension: Secondary | ICD-10-CM | POA: Diagnosis not present

## 2022-03-25 DIAGNOSIS — K130 Diseases of lips: Secondary | ICD-10-CM | POA: Diagnosis not present

## 2022-03-25 DIAGNOSIS — B370 Candidal stomatitis: Secondary | ICD-10-CM | POA: Diagnosis not present

## 2022-04-26 DIAGNOSIS — Z79899 Other long term (current) drug therapy: Secondary | ICD-10-CM | POA: Diagnosis not present

## 2022-05-11 DIAGNOSIS — M810 Age-related osteoporosis without current pathological fracture: Secondary | ICD-10-CM | POA: Diagnosis not present

## 2022-07-27 ENCOUNTER — Other Ambulatory Visit: Payer: Self-pay | Admitting: Family Medicine

## 2022-07-27 DIAGNOSIS — Z1231 Encounter for screening mammogram for malignant neoplasm of breast: Secondary | ICD-10-CM

## 2022-09-06 DIAGNOSIS — E7800 Pure hypercholesterolemia, unspecified: Secondary | ICD-10-CM | POA: Diagnosis not present

## 2022-09-06 DIAGNOSIS — D72829 Elevated white blood cell count, unspecified: Secondary | ICD-10-CM | POA: Diagnosis not present

## 2022-09-06 DIAGNOSIS — E559 Vitamin D deficiency, unspecified: Secondary | ICD-10-CM | POA: Diagnosis not present

## 2022-09-06 DIAGNOSIS — Z79899 Other long term (current) drug therapy: Secondary | ICD-10-CM | POA: Diagnosis not present

## 2022-09-09 DIAGNOSIS — E7800 Pure hypercholesterolemia, unspecified: Secondary | ICD-10-CM | POA: Diagnosis not present

## 2022-09-09 DIAGNOSIS — I10 Essential (primary) hypertension: Secondary | ICD-10-CM | POA: Diagnosis not present

## 2022-09-09 DIAGNOSIS — M810 Age-related osteoporosis without current pathological fracture: Secondary | ICD-10-CM | POA: Diagnosis not present

## 2022-09-09 DIAGNOSIS — Z9181 History of falling: Secondary | ICD-10-CM | POA: Diagnosis not present

## 2022-09-09 DIAGNOSIS — G43909 Migraine, unspecified, not intractable, without status migrainosus: Secondary | ICD-10-CM | POA: Diagnosis not present

## 2022-09-14 ENCOUNTER — Ambulatory Visit
Admission: RE | Admit: 2022-09-14 | Discharge: 2022-09-14 | Disposition: A | Discharge status: Home / Self Care | Payer: Medicare Other | Source: Ambulatory Visit | Attending: Family Medicine | Admitting: Family Medicine

## 2022-09-14 DIAGNOSIS — Z1231 Encounter for screening mammogram for malignant neoplasm of breast: Secondary | ICD-10-CM | POA: Diagnosis not present

## 2022-10-11 DIAGNOSIS — Z79899 Other long term (current) drug therapy: Secondary | ICD-10-CM | POA: Diagnosis not present

## 2022-11-01 DIAGNOSIS — Z79899 Other long term (current) drug therapy: Secondary | ICD-10-CM | POA: Diagnosis not present

## 2022-11-16 DIAGNOSIS — Z79899 Other long term (current) drug therapy: Secondary | ICD-10-CM | POA: Diagnosis not present

## 2022-11-18 DIAGNOSIS — M810 Age-related osteoporosis without current pathological fracture: Secondary | ICD-10-CM | POA: Diagnosis not present

## 2023-03-14 DIAGNOSIS — E7800 Pure hypercholesterolemia, unspecified: Secondary | ICD-10-CM | POA: Diagnosis not present

## 2023-03-14 DIAGNOSIS — Z79899 Other long term (current) drug therapy: Secondary | ICD-10-CM | POA: Diagnosis not present

## 2023-03-17 ENCOUNTER — Other Ambulatory Visit: Payer: Self-pay | Admitting: Family Medicine

## 2023-03-17 DIAGNOSIS — Z23 Encounter for immunization: Secondary | ICD-10-CM | POA: Diagnosis not present

## 2023-03-17 DIAGNOSIS — I10 Essential (primary) hypertension: Secondary | ICD-10-CM | POA: Diagnosis not present

## 2023-03-17 DIAGNOSIS — G43909 Migraine, unspecified, not intractable, without status migrainosus: Secondary | ICD-10-CM | POA: Diagnosis not present

## 2023-03-17 DIAGNOSIS — E2839 Other primary ovarian failure: Secondary | ICD-10-CM

## 2023-03-17 DIAGNOSIS — M810 Age-related osteoporosis without current pathological fracture: Secondary | ICD-10-CM | POA: Diagnosis not present

## 2023-03-17 DIAGNOSIS — E7800 Pure hypercholesterolemia, unspecified: Secondary | ICD-10-CM | POA: Diagnosis not present

## 2023-03-17 DIAGNOSIS — M722 Plantar fascial fibromatosis: Secondary | ICD-10-CM | POA: Diagnosis not present

## 2023-05-24 DIAGNOSIS — Z79899 Other long term (current) drug therapy: Secondary | ICD-10-CM | POA: Diagnosis not present

## 2023-10-27 ENCOUNTER — Ambulatory Visit
Admission: RE | Admit: 2023-10-27 | Discharge: 2023-10-27 | Disposition: A | Discharge status: Home / Self Care | Payer: Medicare Other | Source: Ambulatory Visit | Attending: Family Medicine | Admitting: Family Medicine

## 2023-10-27 DIAGNOSIS — M8588 Other specified disorders of bone density and structure, other site: Secondary | ICD-10-CM | POA: Diagnosis not present

## 2023-10-27 DIAGNOSIS — E2839 Other primary ovarian failure: Secondary | ICD-10-CM

## 2023-11-14 DIAGNOSIS — E7800 Pure hypercholesterolemia, unspecified: Secondary | ICD-10-CM | POA: Diagnosis not present

## 2023-11-14 DIAGNOSIS — Z79899 Other long term (current) drug therapy: Secondary | ICD-10-CM | POA: Diagnosis not present

## 2023-11-14 DIAGNOSIS — E559 Vitamin D deficiency, unspecified: Secondary | ICD-10-CM | POA: Diagnosis not present

## 2023-11-17 DIAGNOSIS — E7800 Pure hypercholesterolemia, unspecified: Secondary | ICD-10-CM | POA: Diagnosis not present

## 2023-11-17 DIAGNOSIS — I10 Essential (primary) hypertension: Secondary | ICD-10-CM | POA: Diagnosis not present

## 2023-11-17 DIAGNOSIS — M810 Age-related osteoporosis without current pathological fracture: Secondary | ICD-10-CM | POA: Diagnosis not present

## 2023-11-17 DIAGNOSIS — Z139 Encounter for screening, unspecified: Secondary | ICD-10-CM | POA: Diagnosis not present

## 2023-11-17 DIAGNOSIS — G43909 Migraine, unspecified, not intractable, without status migrainosus: Secondary | ICD-10-CM | POA: Diagnosis not present

## 2023-11-17 DIAGNOSIS — Z9181 History of falling: Secondary | ICD-10-CM | POA: Diagnosis not present

## 2024-02-28 DIAGNOSIS — Z23 Encounter for immunization: Secondary | ICD-10-CM | POA: Diagnosis not present

## 2024-03-05 ENCOUNTER — Other Ambulatory Visit: Payer: Self-pay | Admitting: Family Medicine

## 2024-03-05 DIAGNOSIS — Z1231 Encounter for screening mammogram for malignant neoplasm of breast: Secondary | ICD-10-CM

## 2024-03-20 ENCOUNTER — Ambulatory Visit
Admission: RE | Admit: 2024-03-20 | Discharge: 2024-03-20 | Disposition: A | Source: Ambulatory Visit | Attending: Family Medicine | Admitting: Family Medicine

## 2024-03-20 DIAGNOSIS — Z1231 Encounter for screening mammogram for malignant neoplasm of breast: Secondary | ICD-10-CM

## 2024-03-26 ENCOUNTER — Other Ambulatory Visit: Payer: Self-pay | Admitting: Family Medicine

## 2024-03-26 DIAGNOSIS — R928 Other abnormal and inconclusive findings on diagnostic imaging of breast: Secondary | ICD-10-CM

## 2024-04-05 ENCOUNTER — Ambulatory Visit
Admission: RE | Admit: 2024-04-05 | Discharge: 2024-04-05 | Disposition: A | Source: Ambulatory Visit | Attending: Family Medicine | Admitting: Family Medicine

## 2024-04-05 ENCOUNTER — Other Ambulatory Visit: Payer: Self-pay | Admitting: Family Medicine

## 2024-04-05 ENCOUNTER — Ambulatory Visit
Admission: RE | Admit: 2024-04-05 | Discharge: 2024-04-05 | Disposition: A | Discharge status: Home / Self Care | Source: Ambulatory Visit | Attending: Family Medicine | Admitting: Family Medicine

## 2024-04-05 DIAGNOSIS — N6310 Unspecified lump in the right breast, unspecified quadrant: Secondary | ICD-10-CM

## 2024-04-05 DIAGNOSIS — R928 Other abnormal and inconclusive findings on diagnostic imaging of breast: Secondary | ICD-10-CM

## 2024-04-10 ENCOUNTER — Ambulatory Visit
Admission: RE | Admit: 2024-04-10 | Discharge: 2024-04-10 | Disposition: A | Discharge status: Home / Self Care | Source: Ambulatory Visit | Attending: Family Medicine | Admitting: Family Medicine

## 2024-04-10 ENCOUNTER — Other Ambulatory Visit: Payer: Self-pay | Admitting: Family Medicine

## 2024-04-10 DIAGNOSIS — N6310 Unspecified lump in the right breast, unspecified quadrant: Secondary | ICD-10-CM

## 2024-04-10 DIAGNOSIS — R928 Other abnormal and inconclusive findings on diagnostic imaging of breast: Secondary | ICD-10-CM

## 2024-04-10 HISTORY — PX: BREAST BIOPSY: SHX20

## 2024-04-11 LAB — SURGICAL PATHOLOGY

## 2024-04-18 ENCOUNTER — Encounter: Payer: Self-pay | Admitting: *Deleted

## 2024-05-03 ENCOUNTER — Telehealth: Payer: Self-pay

## 2024-05-03 ENCOUNTER — Encounter: Payer: Self-pay | Admitting: *Deleted

## 2024-05-03 DIAGNOSIS — Z170 Estrogen receptor positive status [ER+]: Secondary | ICD-10-CM | POA: Insufficient documentation

## 2024-05-03 NOTE — Telephone Encounter (Signed)
 Breast Tumor Boards  Margaret Wilkinson was be presented on the Breast tumor board on 11.26.2025. Margaret Wilkinson has a personal history of breast cancer.  Margaret Wilkinson meets [employer] (NCCN) criteria for genetic testing for Hereditary Breast and Ovarian Cancer Syndrome based on her personal history of breast cancer and her family history of breast caner in 2 maternal aunts and a cousin.   Margaret Fryer, MS, CGC  Certified Genetic Counselor  Email: [email or web address]  Phone: [phone]

## 2024-05-11 ENCOUNTER — Other Ambulatory Visit: Payer: Self-pay | Admitting: General Surgery

## 2024-05-11 DIAGNOSIS — C50412 Malignant neoplasm of upper-outer quadrant of left female breast: Secondary | ICD-10-CM

## 2024-05-11 NOTE — Progress Notes (Signed)
 "   REFERRING PHYSICIAN:  Melia   PROVIDER:  JINA CLAIR NEPHEW, MD  Care Team: Patient Care Team: Hamrick, Charlene CROME, MD as PCP - General (Family Medicine) Nephew Jina Clair, MD as Consulting Provider (Surgical Oncology) Iruku, Linnette Lane Dice, MD as Referring Physician (Hematology and Oncology) Edwyna Backers, MD as Referring Physician (Cardiovascular Disease)   MRN: [id number] DOB: 07-Apr-1946 DATE OF ENCOUNTER: 05/11/2024  Subjective   Chief Complaint: New Consultation     History of Present Illness: Margaret Wilkinson is a 78 y.o. female who is seen today as an office consultation at the request of Dr. Imaging for evaluation of New Consultation .    Pt has a new diagnosis of left breast cancer 03/2024.  She had a screening detected mass.  Diagnostic imaging showed a 5 mm mass at 12 o'clock around 2 cm from the nipple.  Core needle biopsy was performed, demonstrating grade 1 invasive ductal carcinoma, ER/PR +, her 2 negative.  Ki 67 10%.    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  She was recently diagnosed with a small, non-palpable, estrogen receptor positive, HER2 negative, grade 1 invasive carcinoma of the left breast following core needle biopsy. She denies breast pain, palpable mass, nipple changes, skin changes, or other symptoms related to the lesion. She remains asymptomatic and reports no sleep disturbance or significant anxiety regarding the diagnosis.  She previously underwent a breast biopsy for the current lesion and tolerated the procedure and post-procedural compression well. She has not received hormone or antihormone therapy and prefers to avoid hormone-based treatments, stating she has never taken them before.  She has a history of abdominal surgery for a  hepatic cyst, which was complicated by postoperative nausea and significant pleural effusion requiring drainage. She did not experience similar complications following her recent breast biopsy and resumed normal activities without difficulty.  She notes her home blood pressure averages 135/78, though it was elevated today, which she attributes to situational stress. She denies symptoms related to hypertension.   Family cancer history - mother with colon cancer.  Two maternal aunts with breast cancer, one cousin with breast cancer  Work- retired.   Diagnostic mammogram/ultrasound :04/05/2024 ACR Breast Density Category a: The breasts are almost entirely fatty. FINDINGS: Left Mammogram: Spot-compression views demonstrate a round slightly high density mass measuring up to 5 mm about the upper-outer breast, anterior third. No suspicious calcifications or architectural distortion associated with this mass. Left breast ultrasound: Targeted imaging at about the 12 o'clock position, approximately 2 centimeters from the nipple, demonstrates a round hypoechoic mass with indistinct margins and increased vascularity measuring approximately 5 x 5 x 5 mm. This correlates favorably with the mammographic findings described above and requires biopsy for further characterization. Additional imaging of the left axilla was unremarkable. IMPRESSION: Indeterminate left breast mass as above. RECOMMENDATION: Left breast ultrasound-guided biopsy. I have discussed the findings and recommendations with the patient. If applicable, a reminder letter will be sent to the patient regarding the next appointment. BI-RADS CATEGORY  4: Suspicious.    Pathology core needle biopsy: 04/10/2024 1. Breast, left, needle core biopsy, 12 o'clock mass 2 cmfn :       - INVASIVE DUCTAL CARCINOMA, SEE NOTE       - TUBULE FORMATION: SCORE 2       -  NUCLEAR PLEOMORPHISM: SCORE 2       - MITOTIC COUNT: SCORE 1       - TOTAL  SCORE: 5       - OVERALL GRADE: 1       - LYMPHOVASCULAR INVASION: NOT IDENTIFIED       - CANCER LENGTH: 0.4 CM       - CALCIFICATIONS: NOT IDENTIFIED       - OTHER FINDINGS: NONE   Receptors: Estrogen Receptor:  100%, POSITIVE, STRONG STAINING INTENSITY  Progesterone Receptor:  60%, POSITIVE, WEAK-STRONG STAINING INTENSITY  GROUP 5:   HER2 **NEGATIVE**  Proliferation Marker Ki67:  10%    Review of Systems: A complete review of systems was obtained from the patient.  I have reviewed this information and discussed as appropriate with the patient.  See HPI as well for other ROS. ROS - + for tinnitus    Medical History: Past Medical History:  Diagnosis Date   Arthritis    Hypercholesteremia    Hypertension    Liver cyst    Migraine    Osteopenia    Prediabetes    Vitamin D deficiency     Patient Active Problem List  Diagnosis   Malignant neoplasm of upper-outer quadrant of left breast in female, estrogen receptor positive (CMS/HHS-HCC)    Past Surgical History:  Procedure Laterality Date   TONSILLECTOMY  1951   ABDOMINAL HYSTERECTOMY  2004   LAPAROSCOPIC CHOLECYSTECTOMY  2004   Laparoscopic liver cyst unroofing  12/26/2014   Laparoscopic hepatectomy (N/A)  12/26/2014   Breast biopsy (Left)  04/10/2024     No Known Allergies  Current Outpatient Medications on File Prior to Visit  Medication Sig Dispense Refill   atenoloL  (TENORMIN ) 50 MG tablet Take 50 mg by mouth once daily     cholecalciferol (VITAMIN D3) 1000 unit tablet Take 1,000 Units by mouth once daily     lisinopriL  (ZESTRIL ) 20 MG tablet Take 20 mg by mouth once daily     risedronate (ACTONEL) 150 MG tablet 1 TABLET ORALLY EVERY MONTH ADMINISTER 30 MINUTES BEFORE 1ST FOOD/DRINK OF THE DAY OTHER THAN WATER .     rizatriptan (MAXALT) 10 MG tablet Take 10 mg by mouth     No current facility-administered medications on file prior to visit.    Family History  Problem Relation Age of  Onset   Breast cancer Maternal Aunt    Breast cancer Maternal Aunt    Breast cancer Cousin      Social History   Tobacco Use  Smoking Status Never  Smokeless Tobacco Never     Social History   Socioeconomic History   Marital status: Widowed  Tobacco Use   Smoking status: Never   Smokeless tobacco: Never  Substance and Sexual Activity   Alcohol use: Never   Drug use: Never   Social Drivers of Health   Housing Stability: Unknown (05/11/2024)   Housing Stability Vital Sign    Homeless in the Last Year: No    Objective:    Vitals:   05/11/24 1128  BP: (!) 179/85  Pulse: 69  Temp: 37.2 C (98.9 F)  SpO2: 97%  Weight: 76.2 kg (168 lb)  Height: 156.2 cm (5' 1.5)  PainSc: 0-No pain    Body mass index is 31.23 kg/m.   Gen:  No acute distress.  Well nourished and well groomed.   Neurological: Alert and oriented to person, place, and time. Coordination normal.  Head: Normocephalic and atraumatic.  Eyes:  Conjunctivae are normal. Pupils are equal, round, and reactive to light. No scleral icterus.  Neck: Normal range of motion. Neck supple. No tracheal deviation or thyromegaly present.  Cardiovascular: Normal rate, regular rhythm  intact distal pulses.  E Breast: Breast ptotic bilaterally.  Similar size.  Some chronic skin changes on both breast.  No palpable masses, no nipple retraction or nipple discharge.  No contour changes.  No skin dimpling.  No lymphadenopathy in either side. Respiratory: Effort normal.  No respiratory distress. No chest wall tenderness. GI: Soft. Bowel sounds are normal. The abdomen is soft and nontender.  There is no rebound and no guarding.  Musculoskeletal: Normal range of motion. Extremities are nontender.  Lymphadenopathy: No cervical, preauricular, postauricular or axillary adenopathy is present Skin: Skin is warm and dry. No rash noted. No diaphoresis. No erythema. No pallor. No clubbing, cyanosis, or edema.   Psychiatric:  Normal mood and affect. Behavior is normal. Judgment and thought content normal.    Labs None recent Cologard negative 03/2020  Assessment and Plan:     ICD-10-CM   1. Malignant neoplasm of upper-outer quadrant of left breast in female, estrogen receptor positive (CMS/HHS-HCC)  C50.412    Z17.0      Assessment & Plan Estrogen receptor positive malignant neoplasm of upper-outer quadrant of left breast Newly diagnosed, small, grade 1, estrogen receptor positive, HER2 negative carcinoma. Non-palpable, low risk, not life-threatening. Outpatient lumpectomy anticipated with easier recovery and lower complication risk. Main risks include wound complications and 8% chance of re-excision for positive margins. Radiation might be able to be omitted if clear margins achieved. Antihormone therapy discussed as adjuvant treatment - Planned outpatient lumpectomy with seed localization after the new year. - Ordered preoperative appointment, radiology appointment for seed placement, and scheduled surgery date. - Instructed her to obtain a post-surgical compression bra from [facility] or similar provider prior to surgery; provided brochure and contact information for bra fitting and purchase. - Educated her regarding the surgical procedure, anesthesia, incision, tissue removal, intraoperative margin assessment, and wound closure methods (dissolvable sutures, surgical glue, steri-strips). - Advised her to wear the compression bra for at least one week postoperatively, including at night, to minimize swelling and discomfort. - Discussed that radiation therapy is likely not required if adequate margins are achieved due to her age and tumor characteristics. - Discussed antihormone therapy as an alternative to radiation, clarified as antihormonal treatment. - Reassured her regarding the low risk of complications and the outpatient nature of the procedure.  Surgery orders written  No follow-ups on  file.   Jina LITTIE Nephew, MD FACS Surgical Oncology, General Surgery, Trauma and Critical Care [facility]     "

## 2024-05-15 ENCOUNTER — Other Ambulatory Visit: Payer: Self-pay | Admitting: General Surgery

## 2024-05-15 DIAGNOSIS — Z170 Estrogen receptor positive status [ER+]: Secondary | ICD-10-CM

## 2024-05-29 ENCOUNTER — Inpatient Hospital Stay

## 2024-05-29 ENCOUNTER — Inpatient Hospital Stay: Admitting: Hematology and Oncology

## 2024-06-08 ENCOUNTER — Inpatient Hospital Stay

## 2024-06-08 ENCOUNTER — Encounter: Payer: Self-pay | Admitting: *Deleted

## 2024-06-08 ENCOUNTER — Inpatient Hospital Stay: Attending: Hematology and Oncology | Admitting: Hematology and Oncology

## 2024-06-08 VITALS — BP 140/98 | HR 76 | Temp 97.6°F | Resp 18 | Wt 170.1 lb

## 2024-06-08 DIAGNOSIS — Z7983 Long term (current) use of bisphosphonates: Secondary | ICD-10-CM | POA: Insufficient documentation

## 2024-06-08 DIAGNOSIS — Z170 Estrogen receptor positive status [ER+]: Secondary | ICD-10-CM | POA: Diagnosis not present

## 2024-06-08 DIAGNOSIS — M810 Age-related osteoporosis without current pathological fracture: Secondary | ICD-10-CM | POA: Insufficient documentation

## 2024-06-08 DIAGNOSIS — C50912 Malignant neoplasm of unspecified site of left female breast: Secondary | ICD-10-CM | POA: Diagnosis not present

## 2024-06-08 DIAGNOSIS — C50412 Malignant neoplasm of upper-outer quadrant of left female breast: Secondary | ICD-10-CM

## 2024-06-08 NOTE — Progress Notes (Signed)
 Navigator met with patient at her first med onc appt with Dr. Loretha. Acccompanied by her younger sister. Patient is widowed and retired. In good spirits and states she feels very supported with her breast cancer diagnosis and upcoming surgery. Doesn't like driving to [facility] but has lined up family members to bring her to appts. Navigator explained role and gave her my contact information. Breast Cancer book and journey notebook given to patient. Patient has surgery scheduled. Patient did not want to have genetics testing. No XRT needed. No navigation needs at this time.

## 2024-06-08 NOTE — Progress Notes (Signed)
 [facility] CONSULT NOTE  Patient Care Team: Hamrick, Charlene CROME, MD as PCP - General (Family Medicine) Gerome Devere HERO, RN as Oncology Nurse Navigator  CHIEF COMPLAINTS/PURPOSE OF CONSULTATION:  New diagnosis of breast cancer  ASSESSMENT & PLAN:  Assessment & Plan Invasive ductal carcinoma of the left breast Newly diagnosed, small, grade 1, ER/PR-positive invasive ductal carcinoma with low proliferation index. - Confirmed lumpectomy scheduled for June 20, 2024 - Chemotherapy and Oncotype testing not indicated due to favorable tumor biology. - Planned initiation of adjuvant endocrine therapy with anastrozole after surgery: one tablet daily for five years; provided education regarding its mechanism - Reviewed potential side effects of anastrozole, including joint stiffness and hot flashes; will consider alternative therapy if intolerable adverse effects occur. - Offered genetic counseling and testing due to family and personal history; she deferred.  Osteoporosis Osteoporosis with improvement on recent DEXA scan. Currently treated with monthly ibandronate. Ongoing bisphosphonate therapy expected to mitigate bone loss risk associated with planned aromatase inhibitor therapy. - Continued ibandronate (Boniva) 150 mg monthly. - Documented osteoporosis medication in chart. - Assessed bone density and confirmed improvement on recent DEXA scan.   HISTORY OF PRESENTING ILLNESS:  Margaret Wilkinson 79 y.o. female is here because of new diagnosis of breast cancer  Discussed the use of AI scribe software for clinical note transcription with the patient, who gave verbal consent to proceed.  History of Present Illness Margaret Wilkinson is a 79 year old woman with newly diagnosed ER+/PR+ grade 1 invasive ductal carcinoma of the left breast presenting for oncology consultation prior to lumpectomy.  A non-palpable lesion in the left breast was identified on screening mammogram  and confirmed by diagnostic imaging and core biopsy as invasive ductal carcinoma, grade 1, measuring 5x5x5 mm at the twelve o'clock position, approximately two centimeters from the nipple. The tumor is estrogen receptor 100% positive, progesterone receptor 60% positive, and demonstrates a low proliferation index (10%). No axillary lymphadenopathy was detected on imaging. She has not experienced breast symptoms, and self-examination has not revealed any palpable mass. She has not received chemotherapy or radiation therapy.  Comorbidities include osteoporosis, hypertension, hyperlipidemia, and prediabetes, all managed with medication. Osteoporosis is treated with monthly ibandronate, with recent bone density scan showing improvement. She denies joint stiffness or pain and has not used hormone therapy. There is no history of thromboembolic events.  All other systems were reviewed with the patient and are negative.  MEDICAL HISTORY:  Past Medical History:  Diagnosis Date   Arthritis    knees (12/26/2014)   Hypercholesteremia    Hypertension    Knee pain    Liver cyst    Migraine    q 3 months or so (12/26/2014)   Osteopenia    Prediabetes    Sciatica of left side    Tinnitus of both ears    Vitamin D deficiency     SURGICAL HISTORY: Past Surgical History:  Procedure Laterality Date   ABDOMINAL HYSTERECTOMY  ~ 2004   BREAST BIOPSY Left 04/10/2024   US  LT BREAST BX W LOC DEV 1ST LESION IMG BX SPEC US  GUIDE 04/10/2024 [facility] M[facility]MO[facility]   LAPAROSCOPIC CHOLECYSTECTOMY  ~ 2004   LAPAROSCOPIC HEPATECTOMY N/A 12/26/2014   Procedure: LAPAROSCOPIC LIVER CYST UNROOFING;  Surgeon: Jina Nephew, MD;  Location: [facility] OR;  Service: General;  Laterality: N/A;   LAPAROSCOPIC LIVER CYST UNROOFING  12/26/2014   TONSILLECTOMY  ~ 1951    SOCIAL HISTORY: Social History   Socioeconomic History  Marital status: Widowed    Spouse name: Not on file   Number of children: Not on file   Years of education: Not  on file   Highest education level: Not on file  Occupational History   Not on file  Tobacco Use   Smoking status: Never   Smokeless tobacco: Never  Substance and Sexual Activity   Alcohol use: No   Drug use: No   Sexual activity: Not on file  Other Topics Concern   Not on file  Social History Narrative   Not on file   Social Drivers of Health   Tobacco Use: Low Risk  (05/11/2024)   Received from [facility]   Patient History    Smoking Tobacco Use: Never    Smokeless Tobacco Use: Never    Passive Exposure: Not on file  Financial Resource Strain: Not on file  Food Insecurity: Not on file  Transportation Needs: Not on file  Physical Activity: Not on file  Stress: Not on file  Social Connections: Not on file  Intimate Partner Violence: Not on file  Depression (EYV7-0): Not on file  Alcohol Screen: Not on file  Housing: Unknown (05/11/2024)   Received from [facility]   Epic    Unable to Pay for Housing in the Last Year: Not on file    Number of Times Moved in the Last Year: Not on file    At any time in the past 12 months, were you homeless or living in a shelter (including now)?: No  Utilities: Not on file  Health Literacy: Not on file    FAMILY HISTORY: Family History  Problem Relation Age of Onset   Cancer - Colon Mother    Breast cancer Maternal Aunt    Breast cancer Maternal Aunt    Breast cancer Cousin     ALLERGIES:  has no known allergies.  MEDICATIONS:  Current Outpatient Medications  Medication Sig Dispense Refill   atenolol  (TENORMIN ) 50 MG tablet Take 50 mg by mouth daily.     Calcium Carb-Cholecalciferol (CALCIUM 600 + D PO) Take 1 tablet by mouth daily.     cholecalciferol (VITAMIN D) 1000 UNITS tablet Take 1,000 Units by mouth daily.     nitroGLYCERIN  (NITROSTAT ) 0.4 MG SL tablet Place 1 tablet (0.4 mg total) under the tongue every 5 (five) minutes as needed. 25 tablet 3   risedronate (ACTONEL) 150 MG tablet  Take 150 mg by mouth every 30 (thirty) days. with water  on empty stomach, nothing by mouth or lie down for next 30 minutes.     rizatriptan (MAXALT) 10 MG tablet Take 10 mg by mouth as needed for migraine. May repeat in 2 hours if needed     No current facility-administered medications for this visit.     PHYSICAL EXAMINATION: ECOG PERFORMANCE STATUS: 0 - Asymptomatic  Vitals:   06/08/24 1432  BP: (!) 140/98  Pulse: 76  Resp: 18  Temp: 97.6 F (36.4 C)  SpO2: 97%   Filed Weights   06/08/24 1432  Weight: 170 lb 1.6 oz (77.2 kg)    GENERAL:alert, no distress and comfortable Bilateral breasts palpated. No palpable masses No regional adenopathy   LABORATORY DATA:  I have reviewed the data as listed Lab Results  Component Value Date   WBC 17.5 (H) 12/29/2014   HGB 12.2 12/29/2014   HCT 38.6 12/29/2014   MCV 84.8 12/29/2014   PLT 186 12/29/2014     Chemistry  Component Value Date/Time   NA 134 (L) 12/29/2014 0520   K 4.7 12/29/2014 0520   CL 104 12/29/2014 0520   CO2 25 12/29/2014 0520   BUN 7 12/29/2014 0520   CREATININE 0.87 12/29/2014 0520      Component Value Date/Time   CALCIUM 7.7 (L) 12/29/2014 0520   ALKPHOS 71 12/29/2014 0520   AST 22 12/29/2014 0520   ALT 25 12/29/2014 0520   BILITOT 1.0 12/29/2014 0520       RADIOGRAPHIC STUDIES: I have personally reviewed the radiological images as listed and agreed with the findings in the report. No results found.  All questions were answered. The patient knows to call the clinic with any problems, questions or concerns. I spent 45 minutes in the care of this patient including H and P, review of records, counseling and coordination of care.     Amber Stalls, MD 06/08/2024 4:16 PM

## 2024-06-19 ENCOUNTER — Encounter

## 2024-06-20 ENCOUNTER — Encounter

## 2024-06-21 ENCOUNTER — Encounter (HOSPITAL_BASED_OUTPATIENT_CLINIC_OR_DEPARTMENT_OTHER): Payer: Self-pay | Admitting: General Surgery

## 2024-06-21 ENCOUNTER — Other Ambulatory Visit: Payer: Self-pay

## 2024-06-26 ENCOUNTER — Inpatient Hospital Stay: Admission: RE | Admit: 2024-06-26 | Source: Ambulatory Visit

## 2024-06-28 ENCOUNTER — Encounter

## 2024-06-28 ENCOUNTER — Other Ambulatory Visit: Payer: Self-pay | Admitting: General Surgery

## 2024-06-28 DIAGNOSIS — C50412 Malignant neoplasm of upper-outer quadrant of left female breast: Secondary | ICD-10-CM

## 2024-06-28 DIAGNOSIS — I10 Essential (primary) hypertension: Secondary | ICD-10-CM

## 2024-06-29 ENCOUNTER — Ambulatory Visit: Admission: RE | Admit: 2024-06-29 | Source: Ambulatory Visit

## 2024-06-29 ENCOUNTER — Inpatient Hospital Stay: Admission: RE | Admit: 2024-06-29

## 2024-06-29 DIAGNOSIS — C50412 Malignant neoplasm of upper-outer quadrant of left female breast: Secondary | ICD-10-CM

## 2024-06-29 NOTE — H&P (Signed)
 REFERRING PHYSICIAN:  Melia     PROVIDER:  JINA CLAIR NEPHEW, MD   Care Team: Patient Care Team: Hamrick, Charlene CROME, MD as PCP - General (Family Medicine) Nephew Jina Clair, MD as Consulting Provider (Surgical Oncology) Iruku, Linnette Lane Dice, MD as Referring Physician (Hematology and Oncology) Edwyna Backers, MD as Referring Physician (Cardiovascular Disease)    MRN: [id number] DOB: 09-24-1945    Subjective    Chief Complaint: New Consultation       History of Present Illness: Margaret Wilkinson is a 79 y.o. female who is seen today as an office consultation at the request of Dr. Imaging for evaluation of New Consultation .     Pt has a new diagnosis of left breast cancer 03/2024.  She had a screening detected mass.  Diagnostic imaging showed a 5 mm mass at 12 o'clock around 2 cm from the nipple.  Core needle biopsy was performed, demonstrating grade 1 invasive ductal carcinoma, ER/PR +, her 2 negative.  Ki 67 10%.     History of Present Illness Margaret Wilkinson is a 79 year old female with newly diagnosed ER+ grade 1 invasive carcinoma of the upper-outer quadrant of the left breast who presents for surgical consultation regarding breast cancer management.   She was recently diagnosed with a small, non-palpable, estrogen receptor positive, HER2 negative, grade 1 invasive carcinoma of the left breast following core needle biopsy. She denies breast pain, palpable mass, nipple changes, skin changes, or other symptoms related to the lesion. She remains asymptomatic and reports no sleep disturbance or significant anxiety regarding the diagnosis.   She previously underwent a breast biopsy for the current lesion and tolerated the procedure and post-procedural compression well. She has not received hormone or antihormone therapy and prefers to avoid hormone-based treatments, stating she has never taken them before.   She has a history of abdominal surgery for a hepatic cyst, which was  complicated by postoperative nausea and significant pleural effusion requiring drainage. She did not experience similar complications following her recent breast biopsy and resumed normal activities without difficulty.   She notes her home blood pressure averages 135/78, though it was elevated today, which she attributes to situational stress. She denies symptoms related to hypertension.     Family cancer history - mother with colon cancer.  Two maternal aunts with breast cancer, one cousin with breast cancer   Work- retired.     Diagnostic mammogram/ultrasound :04/05/2024 ACR Breast Density Category a: The breasts are almost entirely fatty. FINDINGS: Left Mammogram: Spot-compression views demonstrate a round slightly high density mass measuring up to 5 mm about the upper-outer breast, anterior third. No suspicious calcifications or architectural distortion associated with this mass. Left breast ultrasound: Targeted imaging at about the 12 o'clock position, approximately 2 centimeters from the nipple, demonstrates a round hypoechoic mass with indistinct margins and increased vascularity measuring approximately 5 x 5 x 5 mm. This correlates favorably with the mammographic findings described above and requires biopsy for further characterization. Additional imaging of the left axilla was unremarkable. IMPRESSION: Indeterminate left breast mass as above. RECOMMENDATION: Left breast ultrasound-guided biopsy. I have discussed the findings and recommendations with the patient. If applicable, a reminder letter will be sent to the patient regarding the next appointment. BI-RADS CATEGORY  4: Suspicious.       Pathology core needle biopsy: 04/10/2024 1. Breast, left, needle core biopsy, 12 o'clock mass 2 cmfn :       - INVASIVE DUCTAL CARCINOMA, SEE NOTE       -  TUBULE FORMATION: SCORE 2       - NUCLEAR PLEOMORPHISM: SCORE 2       - MITOTIC COUNT: SCORE 1       - TOTAL SCORE: 5        - OVERALL GRADE: 1       - LYMPHOVASCULAR INVASION: NOT IDENTIFIED       - CANCER LENGTH: 0.4 CM       - CALCIFICATIONS: NOT IDENTIFIED       - OTHER FINDINGS: NONE    Receptors: Estrogen Receptor:  100%, POSITIVE, STRONG STAINING INTENSITY  Progesterone Receptor:  60%, POSITIVE, WEAK-STRONG STAINING INTENSITY  GROUP 5:   HER2 **NEGATIVE**  Proliferation Marker Ki67:  10%      Review of Systems: A complete review of systems was obtained from the patient.  I have reviewed this information and discussed as appropriate with the patient.  See HPI as well for other ROS. ROS - + for tinnitus       Medical History: Past Medical History      Past Medical History:  Diagnosis Date   Arthritis     Hypercholesteremia     Hypertension     Liver cyst     Migraine     Osteopenia     Prediabetes     Vitamin D deficiency          Problem List     Patient Active Problem List  Diagnosis   Malignant neoplasm of upper-outer quadrant of left breast in female, estrogen receptor positive (CMS/HHS-HCC)        Past Surgical History       Past Surgical History:  Procedure Laterality Date   TONSILLECTOMY   1951   ABDOMINAL HYSTERECTOMY   2004   LAPAROSCOPIC CHOLECYSTECTOMY   2004   Laparoscopic liver cyst unroofing   12/26/2014   Laparoscopic hepatectomy (N/A)   12/26/2014   Breast biopsy (Left)   04/10/2024        Allergies  No Known Allergies     Medications Ordered Prior to Encounter        Current Outpatient Medications on File Prior to Visit  Medication Sig Dispense Refill   atenoloL  (TENORMIN ) 50 MG tablet Take 50 mg by mouth once daily       cholecalciferol (VITAMIN D3) 1000 unit tablet Take 1,000 Units by mouth once daily       lisinopriL  (ZESTRIL ) 20 MG tablet Take 20 mg by mouth once daily       risedronate (ACTONEL) 150 MG tablet 1 TABLET ORALLY EVERY MONTH ADMINISTER 30 MINUTES BEFORE 1ST FOOD/DRINK OF THE DAY OTHER THAN WATER .       rizatriptan (MAXALT) 10 MG  tablet Take 10 mg by mouth        No current facility-administered medications on file prior to visit.        Family History       Family History  Problem Relation Age of Onset   Breast cancer Maternal Aunt     Breast cancer Maternal Aunt     Breast cancer Cousin          Tobacco Use History  Social History       Tobacco Use  Smoking Status Never  Smokeless Tobacco Never        Social History  Social History        Socioeconomic History   Marital status: Widowed  Tobacco Use   Smoking status: Never   Smokeless  tobacco: Never  Substance and Sexual Activity   Alcohol use: Never   Drug use: Never    Social Drivers of Health        Housing Stability: Unknown (05/11/2024)    Housing Stability Vital Sign     Homeless in the Last Year: No        Objective:         Vitals:      BP: (!) 179/85  Pulse: 69  Temp: 37.2 C (98.9 F)  SpO2: 97%  Weight: 76.2 kg (168 lb)  Height: 156.2 cm (5' 1.5)  PainSc: 0-No pain    Body mass index is 31.23 kg/m.     Gen:  No acute distress.  Well nourished and well groomed.   Neurological: Alert and oriented to person, place, and time. Coordination normal.  Head: Normocephalic and atraumatic.  Eyes: Conjunctivae are normal. Pupils are equal, round, and reactive to light. No scleral icterus.  Neck: Normal range of motion. Neck supple. No tracheal deviation or thyromegaly present.  Cardiovascular: Normal rate, regular rhythm  intact distal pulses.  E Breast: Breast ptotic bilaterally.  Similar size.  Some chronic skin changes on both breast.  No palpable masses, no nipple retraction or nipple discharge.  No contour changes.  No skin dimpling.  No lymphadenopathy in either side. Respiratory: Effort normal.  No respiratory distress. No chest wall tenderness. GI: Soft. Bowel sounds are normal. The abdomen is soft and nontender.  There is no rebound and no guarding.  Musculoskeletal: Normal range of motion. Extremities are  nontender.  Lymphadenopathy: No cervical, preauricular, postauricular or axillary adenopathy is present Skin: Skin is warm and dry. No rash noted. No diaphoresis. No erythema. No pallor. No clubbing, cyanosis, or edema.   Psychiatric: Normal mood and affect. Behavior is normal. Judgment and thought content normal.      Labs None recent Cologard negative 03/2020   Assessment and Plan:        ICD-10-CM    1. Malignant neoplasm of upper-outer quadrant of left breast in female, estrogen receptor positive (CMS/HHS-HCC)  C50.412      Z17.0         Assessment & Plan Estrogen receptor positive malignant neoplasm of upper-outer quadrant of left breast Newly diagnosed, small, grade 1, estrogen receptor positive, HER2 negative carcinoma. Non-palpable, low risk, not life-threatening. Outpatient lumpectomy anticipated with easier recovery and lower complication risk. Main risks include wound complications and 8% chance of re-excision for positive margins. Radiation might be able to be omitted if clear margins achieved. Antihormone therapy discussed as adjuvant treatment - Planned outpatient lumpectomy with seed localization after the new year. - Ordered preoperative appointment, radiology appointment for seed placement, and scheduled surgery date. - Instructed her to obtain a post-surgical compression bra from [employer] [facility] [place] or similar provider prior to surgery; provided brochure and contact information for bra fitting and purchase. - Educated her regarding the surgical procedure, anesthesia, incision, tissue removal, intraoperative margin assessment, and wound closure methods (dissolvable sutures, surgical glue, steri-strips). - Advised her to wear the compression bra for at least one week postoperatively, including at night, to minimize swelling and discomfort. - Discussed that radiation therapy is likely not required if adequate margins are achieved due to her age and tumor characteristics. -  Discussed antihormone therapy as an alternative to radiation, clarified as antihormonal treatment. - Reassured her regarding the low risk of complications and the outpatient nature of the procedure.

## 2024-07-03 ENCOUNTER — Encounter (HOSPITAL_COMMUNITY): Payer: Self-pay | Admitting: Vascular Surgery

## 2024-07-03 ENCOUNTER — Encounter

## 2024-07-03 ENCOUNTER — Encounter (HOSPITAL_COMMUNITY): Admission: RE | Payer: Self-pay | Source: Home / Self Care

## 2024-07-03 ENCOUNTER — Ambulatory Visit (HOSPITAL_COMMUNITY): Admission: RE | Admit: 2024-07-03 | Source: Home / Self Care | Admitting: General Surgery

## 2024-07-03 DIAGNOSIS — I10 Essential (primary) hypertension: Secondary | ICD-10-CM

## 2024-07-12 ENCOUNTER — Inpatient Hospital Stay: Admitting: Hematology and Oncology
